# Patient Record
Sex: Female | Born: 1945 | Race: White | Hispanic: No | Smoking: Former smoker
Health system: Southern US, Community
[De-identification: ages and names within clinical notes are randomized; demographics above are authoritative.]

## PROBLEM LIST (undated history)

## (undated) DIAGNOSIS — R011 Cardiac murmur, unspecified: Secondary | ICD-10-CM

## (undated) DIAGNOSIS — J45909 Unspecified asthma, uncomplicated: Secondary | ICD-10-CM

## (undated) DIAGNOSIS — J349 Unspecified disorder of nose and nasal sinuses: Secondary | ICD-10-CM

## (undated) DIAGNOSIS — C801 Malignant (primary) neoplasm, unspecified: Secondary | ICD-10-CM

## (undated) DIAGNOSIS — R51 Headache: Secondary | ICD-10-CM

## (undated) DIAGNOSIS — E119 Type 2 diabetes mellitus without complications: Secondary | ICD-10-CM

## (undated) DIAGNOSIS — R519 Headache, unspecified: Secondary | ICD-10-CM

## (undated) DIAGNOSIS — H409 Unspecified glaucoma: Secondary | ICD-10-CM

## (undated) DIAGNOSIS — F319 Bipolar disorder, unspecified: Secondary | ICD-10-CM

## (undated) DIAGNOSIS — M1990 Unspecified osteoarthritis, unspecified site: Secondary | ICD-10-CM

## (undated) DIAGNOSIS — K449 Diaphragmatic hernia without obstruction or gangrene: Secondary | ICD-10-CM

## (undated) DIAGNOSIS — I10 Essential (primary) hypertension: Secondary | ICD-10-CM

## (undated) DIAGNOSIS — K219 Gastro-esophageal reflux disease without esophagitis: Secondary | ICD-10-CM

## (undated) DIAGNOSIS — E7800 Pure hypercholesterolemia, unspecified: Secondary | ICD-10-CM

## (undated) HISTORY — PX: ABDOMINAL HYSTERECTOMY: SHX81

## (undated) HISTORY — PX: CHOLECYSTECTOMY: SHX55

## (undated) HISTORY — PX: NASAL SINUS SURGERY: SHX719

## (undated) HISTORY — DX: Bipolar disorder, unspecified: F31.9

## (undated) HISTORY — DX: Unspecified disorder of nose and nasal sinuses: J34.9

## (undated) HISTORY — PX: TONSILLECTOMY: SUR1361

## (undated) HISTORY — DX: Diaphragmatic hernia without obstruction or gangrene: K44.9

## (undated) HISTORY — DX: Unspecified asthma, uncomplicated: J45.909

## (undated) HISTORY — PX: DILATION AND CURETTAGE OF UTERUS: SHX78

## (undated) HISTORY — DX: Type 2 diabetes mellitus without complications: E11.9

## (undated) HISTORY — PX: REPLACEMENT TOTAL KNEE: SUR1224

## (undated) HISTORY — PX: TUBAL LIGATION: SHX77

## (undated) HISTORY — DX: Gastro-esophageal reflux disease without esophagitis: K21.9

## (undated) HISTORY — DX: Pure hypercholesterolemia, unspecified: E78.00

## (undated) HISTORY — DX: Essential (primary) hypertension: I10

---

## 2009-02-08 HISTORY — PX: OTHER SURGICAL HISTORY: SHX169

## 2016-03-08 ENCOUNTER — Encounter: Payer: Self-pay | Admitting: Internal Medicine

## 2016-03-31 ENCOUNTER — Ambulatory Visit (INDEPENDENT_AMBULATORY_CARE_PROVIDER_SITE_OTHER): Payer: Medicare Other | Admitting: Gastroenterology

## 2016-03-31 ENCOUNTER — Encounter: Payer: Self-pay | Admitting: Gastroenterology

## 2016-03-31 ENCOUNTER — Other Ambulatory Visit: Payer: Self-pay

## 2016-03-31 VITALS — BP 111/67 | HR 86 | Temp 98.3°F | Ht 63.0 in | Wt 253.4 lb

## 2016-03-31 DIAGNOSIS — R198 Other specified symptoms and signs involving the digestive system and abdomen: Secondary | ICD-10-CM | POA: Diagnosis not present

## 2016-03-31 DIAGNOSIS — Z800 Family history of malignant neoplasm of digestive organs: Secondary | ICD-10-CM | POA: Insufficient documentation

## 2016-03-31 DIAGNOSIS — K5900 Constipation, unspecified: Secondary | ICD-10-CM

## 2016-03-31 MED ORDER — PEG 3350-KCL-NA BICARB-NACL 420 G PO SOLR: 4000.0000 mL | ORAL | 0 refills | Status: DC

## 2016-03-31 NOTE — Progress Notes (Addendum)
Primary Care Physician:  Keane Police, MD Primary Gastroenterologist:  Dr. Oneida Alar   Chief Complaint  Patient presents with  . Constipation  . Emesis    HPI:   Tammy Love is a 71 y.o. female presenting today at the request of her PCP secondary to constipation and vomiting.    Recently had some burning in rectum and some constipation. Would have some loose bowel movements for about 3 days, then start getting watery and took imodium. Today didn't want to come out but finally did. Feels like it just lays in the bottom of her rectum and has to take a tissue and push it out. Wonders if it is the imodium. Most days having to push it out. Present for a few months. Scant hematochezia after straining. Abdominal discomfort with constipation. Was taking trulicity, which she feels was causing N/V. After stopping Trulicity, this resolved. No dysphagia.   Colonoscopy by Dr. Earley Brooke 2011: mucosa up to terminal ileum appeared normal, biopsies from TI and rectum. Microscopic colitis.   Sister, Deirdre Pippins, recently diagnosed with colon cancer.  Past Medical History:  Diagnosis Date  . Asthma   . Bipolar disorder ([place])   . Diabetes ([place])   . GERD (gastroesophageal reflux disease)   . Hiatal hernia   . Hypercholesterolemia   . Hypertension   . Sinus disease     Past Surgical History:  Procedure Laterality Date  . ABDOMINAL HYSTERECTOMY    . colonoscopy  2011   Dr. Earley Brooke: mucosa up to terminal ileum appeared normal, biopsies from TI and rectum. MICROSCOPIC COLITIS   . NASAL SINUS SURGERY    . REPLACEMENT TOTAL KNEE     left  . TONSILLECTOMY    . TUBAL LIGATION      Current Outpatient Prescriptions  Medication Sig Dispense Refill  . aspirin 325 MG tablet Take 325 mg by mouth daily.    Marland Kitchen atorvastatin (LIPITOR) 80 MG tablet Take 80 mg by mouth daily.    . budesonide-formoterol (SYMBICORT) 160-4.5 MCG/ACT inhaler Inhale 2 puffs into the lungs 2 (two) times daily.    .  celecoxib (CELEBREX) 200 MG capsule Take 200 mg by mouth 2 (two) times daily.    . flunisolide (NASALIDE) 25 MCG/ACT (0.025%) SOLN Place 2 sprays into the nose 2 (two) times daily.    Marland Kitchen lisinopril (PRINIVIL,ZESTRIL) 10 MG tablet Take 10 mg by mouth daily.    . Multiple Vitamin (MULTIVITAMIN) capsule Take 1 capsule by mouth daily.    Marland Kitchen omeprazole (PRILOSEC) 20 MG capsule Take 20 mg by mouth daily.    . potassium chloride (MICRO-K) 10 MEQ CR capsule Take 10 mEq by mouth 2 (two) times daily.    . QUEtiapine (SEROQUEL) 25 MG tablet Take 25 mg by mouth at bedtime.    . raloxifene (EVISTA) 60 MG tablet Take 60 mg by mouth daily.    . sitaGLIPtin (JANUVIA) 100 MG tablet Take 100 mg by mouth daily.    Marland Kitchen topiramate (TOPAMAX) 100 MG tablet Take 150 mg by mouth. 3 at bedtime    . traMADol (ULTRAM) 50 MG tablet Take 50 mg by mouth every 6 (six) hours as needed.     No current facility-administered medications for this visit.     Allergies as of 03/31/2016 - Review Complete 03/31/2016  Allergen Reaction Noted  . Ivp dye [iodinated diagnostic agents]  03/31/2016  . Penicillins Itching and Rash 12/15/2009  . Sulfa antibiotics Itching and Rash 12/15/2009  . Sulfamethoxazole-trimethoprim Itching  and Rash 12/15/2009  . Sulfasalazine Itching and Rash 12/15/2009    Family History  Problem Relation Age of Onset  . Colon cancer Sister [place]     Social History   Social History  . Marital status: Widowed    Spouse name: N/A  . Number of children: N/A  . Years of education: N/A   Occupational History  . Not on file.   Social History Main Topics  . Smoking status: Never Smoker  . Smokeless tobacco: Never Used  . Alcohol use No  . Drug use: No  . Sexual activity: Not on file   Other Topics Concern  . Not on file   Social History Narrative  . No narrative on file    Review of Systems: Gen: Denies any fever, chills, fatigue, weight loss, lack of appetite.  CV: Denies chest  pain, heart palpitations, peripheral edema, syncope.  Resp: +DOE GI: see HPI  GU : Denies urinary burning, urinary frequency, urinary hesitancy MS: +muscle aches/pains  Derm: Denies rash, itching, dry skin Psych: Denies depression, anxiety, memory loss, and confusion Heme: see HPI   Physical Exam: BP 111/67   Pulse 86   Temp 98.3 F (36.8 C) (Oral)   Ht 5\' 3"  (1.6 m)   Wt 253 lb 6.4 oz (114.9 kg)   BMI 44.89 kg/m  General:   Alert and oriented. Pleasant and cooperative. Well-nourished and well-developed.  Head:  Normocephalic and atraumatic. Eyes:  Without icterus, sclera clear and conjunctiva pink.  Ears:  Normal auditory acuity. Nose:  No deformity, discharge,  or lesions. Mouth:  No deformity or lesions, oral mucosa pink.  Lungs:  Clear to auscultation bilaterally. No wheezes, rales, or rhonchi. No distress.  Heart:  S1, S2 present with systolic murmur  Abdomen:  +BS, soft, obese. Left-sided abdomen notably larger than right. Abdominal fullness and laxity, question hernia Rectal:  Small non-thrombosed anterior hemorrhoid, no internal mass appreciated  Msk:  Symmetrical without gross deformities. Normal posture. Extremities:  Without edema. Neurologic:  Alert and  oriented x4 Psych:  Alert and cooperative. Normal mood and affect.

## 2016-03-31 NOTE — Patient Instructions (Signed)
We have scheduled you for a CT scan to further evaluate your abdomen.   Start taking Amitiza 8 mcg with breakfast and dinner for constipation. Make sure you take with food to avoid nausea. I have given you samples. Let me know how this goes!  We have scheduled you for a colonoscopy with Dr. Oneida Alar in the near future!

## 2016-04-01 ENCOUNTER — Telehealth: Payer: Self-pay

## 2016-04-01 NOTE — Telephone Encounter (Signed)
Called pt and informed pt of pre-op appt 04/08/16 at 12:45pm.

## 2016-04-05 ENCOUNTER — Encounter: Payer: Self-pay | Admitting: Gastroenterology

## 2016-04-05 NOTE — Assessment & Plan Note (Signed)
Start Amitiza 8 mcg po BID. Samples provided. As of note, patient reports having to digitally disimpact herself. Rectal exam performed without obvious mass. Hemorrhoids likely culprit of low-volume hematochezia but with family history of colon cancer, needs colonoscopy.

## 2016-04-05 NOTE — Assessment & Plan Note (Signed)
71 year old female with family history of colon cancer (sister just diagnosed), presenting with constipation, heme positive stool. As of note, vomiting resolved after discontinuing Trulicity, and she has no other upper GI symptoms of concern. On exam, she has left-sided abdomen "fullness" and laxity, query hernia. Will order CT prior to colonoscopy to evaluate anatomy.  Proceed with colonoscopy with Dr. Oneida Alar in the near future. The risks, benefits, and alternatives have been discussed in detail with the patient. They state understanding and desire to proceed.  PROPOFOL due to polypharmacy Amitiza 8 mcg for constipation  CT ordered for further evaluation of abdomen

## 2016-04-05 NOTE — Assessment & Plan Note (Signed)
CT ordered. 

## 2016-04-06 NOTE — Patient Instructions (Signed)
Tammy Love  04/06/2016     @PREFPERIOPPHARMACY @   Your procedure is scheduled on  04/13/2016   Report to [facility] at  64  A.M.  Call this number if you have problems the morning of surgery:  [phone]   Remember:  Do not eat food or drink liquids after midnight.  Take these medicines the morning of surgery with A SIP OF WATER  Celebrex, lisinopril, prilosec, topamax, ultram. Take your inhalers before you come and bring any rescue inhaler with you.   Do not wear jewelry, make-up or nail polish.  Do not wear lotions, powders, or perfumes, or deoderant.  Do not shave 48 hours prior to surgery.  Men may shave face and neck.  Do not bring valuables to the hospital.  [facility] is not responsible for any belongings or valuables.  Contacts, dentures or bridgework may not be worn into surgery.  Leave your suitcase in the car.  After surgery it may be brought to your room.  For patients admitted to the hospital, discharge time will be determined by your treatment team.  Patients discharged the day of surgery will not be allowed to drive home.   Name and phone number of your driver:   family Special instructions:  Follow the diet and prep instructions given to you by Dr Nona Dell office.  Please read over the following fact sheets that you were given. Anesthesia Post-op Instructions and Care and Recovery After Surgery       Colonoscopy, Adult A colonoscopy is an exam to look at the entire large intestine. During the exam, a lubricated, bendable tube is inserted into the anus and then passed into the rectum, colon, and other parts of the large intestine. A colonoscopy is often done as a part of normal colorectal screening or in response to certain symptoms, such as anemia, persistent diarrhea, abdominal pain, and blood in the stool. The exam can help screen for and diagnose medical problems, including:  Tumors.  Polyps.  Inflammation.  Areas of bleeding. Tell  a health care provider about:  Any allergies you have.  All medicines you are taking, including vitamins, herbs, eye drops, creams, and over-the-counter medicines.  Any problems you or family members have had with anesthetic medicines.  Any blood disorders you have.  Any surgeries you have had.  Any medical conditions you have.  Any problems you have had passing stool. What are the risks? Generally, this is a safe procedure. However, problems may occur, including:  Bleeding.  A tear in the intestine.  A reaction to medicines given during the exam.  Infection (rare). What happens before the procedure? Eating and drinking restrictions  Follow instructions from your health care provider about eating and drinking, which may include:  A few days before the procedure - follow a low-fiber diet. Avoid nuts, seeds, dried fruit, raw fruits, and vegetables.  1-3 days before the procedure - follow a clear liquid diet. Drink only clear liquids, such as clear broth or bouillon, black coffee or tea, clear juice, clear soft drinks or sports drinks, gelatin dessert, and popsicles. Avoid any liquids that contain red or purple dye.  On the day of the procedure - do not eat or drink anything during the 2 hours before the procedure, or within the time period that your health care provider recommends. Bowel prep  If you were prescribed an oral bowel prep to clean out your colon:  Take it  as told by your health care provider. Starting the day before your procedure, you will need to drink a large amount of medicated liquid. The liquid will cause you to have multiple loose stools until your stool is almost clear or light green.  If your skin or anus gets irritated from diarrhea, you may use these to relieve the irritation:  Medicated wipes, such as adult wet wipes with aloe and vitamin E.  A skin soothing-product like petroleum jelly.  If you vomit while drinking the bowel prep, take a break for  up to 60 minutes and then begin the bowel prep again. If vomiting continues and you cannot take the bowel prep without vomiting, call your health care provider. General instructions   Ask your health care provider about changing or stopping your regular medicines. This is especially important if you are taking diabetes medicines or blood thinners.  Plan to have someone take you home from the hospital or clinic. What happens during the procedure?  An IV tube may be inserted into one of your veins.  You will be given medicine to help you relax (sedative).  To reduce your risk of infection:  Your health care team will wash or sanitize their hands.  Your anal area will be washed with soap.  You will be asked to lie on your side with your knees bent.  Your health care provider will lubricate a long, thin, flexible tube. The tube will have a camera and a light on the end.  The tube will be inserted into your anus.  The tube will be gently eased through your rectum and colon.  Air will be delivered into your colon to keep it open. You may feel some pressure or cramping.  The camera will be used to take images during the procedure.  A small tissue sample may be removed from your body to be examined under a microscope (biopsy). If any potential problems are found, the tissue will be sent to a lab for testing.  If small polyps are found, your health care provider may remove them and have them checked for cancer cells.  The tube that was inserted into your anus will be slowly removed. The procedure may vary among health care providers and hospitals. What happens after the procedure?  Your blood pressure, heart rate, breathing rate, and blood oxygen level will be monitored until the medicines you were given have worn off.  Do not drive for 24 hours after the exam.  You may have a small amount of blood in your stool.  You may pass gas and have mild abdominal cramping or bloating due to  the air that was used to inflate your colon during the exam.  It is up to you to get the results of your procedure. Ask your health care provider, or the department performing the procedure, when your results will be ready. This information is not intended to replace advice given to you by your health care provider. Make sure you discuss any questions you have with your health care provider. Document Released: 01/23/2000 Document Revised: 11/26/2015 Document Reviewed: 04/08/2015 Elsevier Interactive Patient Education  2017 [employer]  Colonoscopy, Adult, Care After This sheet gives you information about how to care for yourself after your procedure. Your health care provider may also give you more specific instructions. If you have problems or questions, contact your health care provider. What can I expect after the procedure? After the procedure, it is common to have:  A small  amount of blood in your stool for 24 hours after the procedure.  Some gas.  Mild abdominal cramping or bloating. Follow these instructions at home: General instructions    For the first 24 hours after the procedure:  Do not drive or use machinery.  Do not sign important documents.  Do not drink alcohol.  Do your regular daily activities at a slower pace than normal.  Eat soft, easy-to-digest foods.  Rest often.  Take over-the-counter or prescription medicines only as told by your health care provider.  It is up to you to get the results of your procedure. Ask your health care provider, or the department performing the procedure, when your results will be ready. Relieving cramping and bloating   Try walking around when you have cramps or feel bloated.  Apply heat to your abdomen as told by your health care provider. Use a heat source that your health care provider recommends, such as a moist heat pack or a heating pad.  Place a towel between your skin and the heat source.  Leave the heat on for  20-30 minutes.  Remove the heat if your skin turns bright red. This is especially important if you are unable to feel pain, heat, or cold. You may have a greater risk of getting burned. Eating and drinking   Drink enough fluid to keep your urine clear or pale yellow.  Resume your normal diet as instructed by your health care provider. Avoid heavy or fried foods that are hard to digest.  Avoid drinking alcohol for as long as instructed by your health care provider. Contact a health care provider if:  You have blood in your stool 2-3 days after the procedure. Get help right away if:  You have more than a small spotting of blood in your stool.  You pass large blood clots in your stool.  Your abdomen is swollen.  You have nausea or vomiting.  You have a fever.  You have increasing abdominal pain that is not relieved with medicine. This information is not intended to replace advice given to you by your health care provider. Make sure you discuss any questions you have with your health care provider. Document Released: 09/09/2003 Document Revised: 10/20/2015 Document Reviewed: 04/08/2015 Elsevier Interactive Patient Education  2017 [place] Anesthesia is a term that refers to techniques, procedures, and medicines that help a person stay safe and comfortable during a medical procedure. Monitored anesthesia care, or sedation, is one type of anesthesia. Your anesthesia specialist may recommend sedation if you will be having a procedure that does not require you to be unconscious, such as:  Cataract surgery.  A dental procedure.  A biopsy.  A colonoscopy. During the procedure, you may receive a medicine to help you relax (sedative). There are three levels of sedation:  Mild sedation. At this level, you may feel awake and relaxed. You will be able to follow directions.  Moderate sedation. At this level, you will be sleepy. You may not remember the  procedure.  Deep sedation. At this level, you will be asleep. You will not remember the procedure. The more medicine you are given, the deeper your level of sedation will be. Depending on how you respond to the procedure, the anesthesia specialist may change your level of sedation or the type of anesthesia to fit your needs. An anesthesia specialist will monitor you closely during the procedure. Let your health care provider know about:  Any allergies you have.  All medicines you are taking, including vitamins, herbs, eye drops, creams, and over-the-counter medicines.  Any use of steroids (by mouth or as a cream).  Any problems you or family members have had with sedatives and anesthetic medicines.  Any blood disorders you have.  Any surgeries you have had.  Any medical conditions you have, such as sleep apnea.  Whether you are pregnant or may be pregnant.  Any use of cigarettes, alcohol, or street drugs. What are the risks? Generally, this is a safe procedure. However, problems may occur, including:  Getting too much medicine (oversedation).  Nausea.  Allergic reaction to medicines.  Trouble breathing. If this happens, a breathing tube may be used to help with breathing. It will be removed when you are awake and breathing on your own.  Heart trouble.  Lung trouble. Before the procedure Staying hydrated  Follow instructions from your health care provider about hydration, which may include:  Up to 2 hours before the procedure - you may continue to drink clear liquids, such as water, clear fruit juice, black coffee, and plain tea. Eating and drinking restrictions  Follow instructions from your health care provider about eating and drinking, which may include:  8 hours before the procedure - stop eating heavy meals or foods such as meat, fried foods, or fatty foods.  6 hours before the procedure - stop eating light meals or foods, such as toast or cereal.  6 hours before  the procedure - stop drinking milk or drinks that contain milk.  2 hours before the procedure - stop drinking clear liquids. Medicines  Ask your health care provider about:  Changing or stopping your regular medicines. This is especially important if you are taking diabetes medicines or blood thinners.  Taking medicines such as aspirin and ibuprofen. These medicines can thin your blood. Do not take these medicines before your procedure if your health care provider instructs you not to. Tests and exams  You will have a physical exam.  You may have blood tests done to show:  How well your kidneys and liver are working.  How well your blood can clot.  General instructions  Plan to have someone take you home from the hospital or clinic.  If you will be going home right after the procedure, plan to have someone with you for 24 hours. What happens during the procedure?  Your blood pressure, heart rate, breathing, level of pain and overall condition will be monitored.  An IV tube will be inserted into one of your veins.  Your anesthesia specialist will give you medicines as needed to keep you comfortable during the procedure. This may mean changing the level of sedation.  The procedure will be performed. After the procedure  Your blood pressure, heart rate, breathing rate, and blood oxygen level will be monitored until the medicines you were given have worn off.  Do not drive for 24 hours if you received a sedative.  You may:  Feel sleepy, clumsy, or nauseous.  Feel forgetful about what happened after the procedure.  Have a sore throat if you had a breathing tube during the procedure.  Vomit. This information is not intended to replace advice given to you by your health care provider. Make sure you discuss any questions you have with your health care provider. Document Released: 10/21/2004 Document Revised: 07/04/2015 Document Reviewed: 05/18/2015 Elsevier Interactive  Patient Education  2017 [place], Care After These instructions provide you with information about caring for  yourself after your procedure. Your health care provider may also give you more specific instructions. Your treatment has been planned according to current medical practices, but problems sometimes occur. Call your health care provider if you have any problems or questions after your procedure. What can I expect after the procedure? After your procedure, it is common to:  Feel sleepy for several hours.  Feel clumsy and have poor balance for several hours.  Feel forgetful about what happened after the procedure.  Have poor judgment for several hours.  Feel nauseous or vomit.  Have a sore throat if you had a breathing tube during the procedure. Follow these instructions at home: For at least 24 hours after the procedure:    Do not:  Participate in activities in which you could fall or become injured.  Drive.  Use heavy machinery.  Drink alcohol.  Take sleeping pills or medicines that cause drowsiness.  Make important decisions or sign legal documents.  Take care of children on your own.  Rest. Eating and drinking   Follow the diet that is recommended by your health care provider.  If you vomit, drink water, juice, or soup when you can drink without vomiting.  Make sure you have little or no nausea before eating solid foods. General instructions   Have a responsible adult stay with you until you are awake and alert.  Take over-the-counter and prescription medicines only as told by your health care provider.  If you smoke, do not smoke without supervision.  Keep all follow-up visits as told by your health care provider. This is important. Contact a health care provider if:  You keep feeling nauseous or you keep vomiting.  You feel light-headed.  You develop a rash.  You have a fever. Get help right away if:  You have  trouble breathing. This information is not intended to replace advice given to you by your health care provider. Make sure you discuss any questions you have with your health care provider. Document Released: 05/18/2015 Document Revised: 09/17/2015 Document Reviewed: 05/18/2015 Elsevier Interactive Patient Education  2017 [employer].

## 2016-04-07 NOTE — Progress Notes (Signed)
CC'ED TO PCP 

## 2016-04-08 ENCOUNTER — Encounter (HOSPITAL_COMMUNITY)
Admission: RE | Admit: 2016-04-08 | Discharge: 2016-04-08 | Disposition: A | Discharge status: Home / Self Care | Payer: Medicare Other | Source: Ambulatory Visit | Attending: Gastroenterology | Admitting: Gastroenterology

## 2016-04-08 ENCOUNTER — Ambulatory Visit (HOSPITAL_COMMUNITY)
Admission: RE | Admit: 2016-04-08 | Discharge: 2016-04-08 | Disposition: A | Discharge status: Home / Self Care | Payer: Medicare Other | Source: Ambulatory Visit | Attending: Gastroenterology | Admitting: Gastroenterology

## 2016-04-08 ENCOUNTER — Encounter (HOSPITAL_COMMUNITY): Payer: Self-pay

## 2016-04-08 DIAGNOSIS — K4020 Bilateral inguinal hernia, without obstruction or gangrene, not specified as recurrent: Secondary | ICD-10-CM | POA: Insufficient documentation

## 2016-04-08 DIAGNOSIS — R198 Other specified symptoms and signs involving the digestive system and abdomen: Secondary | ICD-10-CM | POA: Diagnosis not present

## 2016-04-08 DIAGNOSIS — K429 Umbilical hernia without obstruction or gangrene: Secondary | ICD-10-CM | POA: Insufficient documentation

## 2016-04-08 HISTORY — DX: Cardiac murmur, unspecified: R01.1

## 2016-04-08 HISTORY — DX: Headache: R51

## 2016-04-08 HISTORY — DX: Headache, unspecified: R51.9

## 2016-04-08 HISTORY — DX: Unspecified osteoarthritis, unspecified site: M19.90

## 2016-04-08 HISTORY — DX: Malignant (primary) neoplasm, unspecified: C80.1

## 2016-04-08 LAB — BASIC METABOLIC PANEL
Anion gap: 6 (ref 5–15)
BUN: 12 mg/dL (ref 6–20)
CALCIUM: 8.9 mg/dL (ref 8.9–10.3)
CO2: 25 mmol/L (ref 22–32)
Chloride: 110 mmol/L (ref 101–111)
Creatinine, Ser: 0.9 mg/dL (ref 0.44–1.00)
GFR calc Af Amer: >60 mL/min (ref >60)
Glucose, Bld: 103 mg/dL — ABNORMAL HIGH (ref 65–99)
Potassium: 3.4 mmol/L — ABNORMAL LOW (ref 3.5–5.1)
SODIUM: 141 mmol/L (ref 135–145)

## 2016-04-08 LAB — CBC WITH DIFFERENTIAL/PLATELET
BASOS PCT: 0 %
Basophils Absolute: 0 10*3/uL (ref 0.0–0.1)
EOS ABS: 0.2 10*3/uL (ref 0.0–0.7)
Eosinophils Relative: 3 %
HCT: 36.2 % (ref 36.0–46.0)
HEMOGLOBIN: 11.9 g/dL — AB (ref 12.0–15.0)
Lymphocytes Relative: 19 %
Lymphs Abs: 1.5 10*3/uL (ref 0.7–4.0)
MCH: 28.9 pg (ref 26.0–34.0)
MCHC: 32.9 g/dL (ref 30.0–36.0)
MCV: 87.9 fL (ref 78.0–100.0)
Monocytes Absolute: 0.5 10*3/uL (ref 0.1–1.0)
Monocytes Relative: 6 %
NEUTROS ABS: 5.8 10*3/uL (ref 1.7–7.7)
NEUTROS PCT: 72 %
Platelets: 250 10*3/uL (ref 150–400)
RBC: 4.12 MIL/uL (ref 3.87–5.11)
RDW: 13.8 % (ref 11.5–15.5)
WBC: 8 10*3/uL (ref 4.0–10.5)

## 2016-04-12 ENCOUNTER — Other Ambulatory Visit: Payer: Self-pay

## 2016-04-19 NOTE — Progress Notes (Signed)
CT abdomen reviewed. Mild stranding of pericolonic fat in left pelvis but clinically not presenting with signs of panniculitis or appendagitis. She has asymmetric mild thickening of subcutaneous fat in LUQ abdominal wall as noted clinically and likely an anatomic variant. No ventral hernia noted. Proceed with procedures as planned.

## 2016-04-19 NOTE — Patient Instructions (Signed)
Tammy Love  04/19/2016     @PREFPERIOPPHARMACY @   Your procedure is scheduled on  04/27/2016   Report to [facility] at  615  A.M.  Call this number if you have problems the morning of surgery:  [phone]   Remember:  Do not eat food or drink liquids after midnight.  Take these medicines the morning of surgery with A SIP OF WATER  Celebrex, lisinopril, prilosec, evista, topamax, tramdol. Take your inhalers before you come. DO NOT take any medications for diabetes the morning of your procedure.   Do not wear jewelry, make-up or nail polish.  Do not wear lotions, powders, or perfumes, or deoderant.  Do not shave 48 hours prior to surgery.  Men may shave face and neck.  Do not bring valuables to the hospital.  [facility] is not responsible for any belongings or valuables.  Contacts, dentures or bridgework may not be worn into surgery.  Leave your suitcase in the car.  After surgery it may be brought to your room.  For patients admitted to the hospital, discharge time will be determined by your treatment team.  Patients discharged the day of surgery will not be allowed to drive home.   Name and phone number of your driver:   family Special instructions:  Follow the diet and prep instructions given to you by Dr Nona Dell office.  Please read over the following fact sheets that you were given. Anesthesia Post-op Instructions and Care and Recovery After Surgery       Colonoscopy, Adult A colonoscopy is an exam to look at the entire large intestine. During the exam, a lubricated, bendable tube is inserted into the anus and then passed into the rectum, colon, and other parts of the large intestine. A colonoscopy is often done as a part of normal colorectal screening or in response to certain symptoms, such as anemia, persistent diarrhea, abdominal pain, and blood in the stool. The exam can help screen for and diagnose medical problems,  including:  Tumors.  Polyps.  Inflammation.  Areas of bleeding. Tell a health care provider about:  Any allergies you have.  All medicines you are taking, including vitamins, herbs, eye drops, creams, and over-the-counter medicines.  Any problems you or family members have had with anesthetic medicines.  Any blood disorders you have.  Any surgeries you have had.  Any medical conditions you have.  Any problems you have had passing stool. What are the risks? Generally, this is a safe procedure. However, problems may occur, including:  Bleeding.  A tear in the intestine.  A reaction to medicines given during the exam.  Infection (rare). What happens before the procedure? Eating and drinking restrictions  Follow instructions from your health care provider about eating and drinking, which may include:  A few days before the procedure - follow a low-fiber diet. Avoid nuts, seeds, dried fruit, raw fruits, and vegetables.  1-3 days before the procedure - follow a clear liquid diet. Drink only clear liquids, such as clear broth or bouillon, black coffee or tea, clear juice, clear soft drinks or sports drinks, gelatin dessert, and popsicles. Avoid any liquids that contain red or purple dye.  On the day of the procedure - do not eat or drink anything during the 2 hours before the procedure, or within the time period that your health care provider recommends. Bowel prep  If you were prescribed an oral bowel prep to clean  out your colon:  Take it as told by your health care provider. Starting the day before your procedure, you will need to drink a large amount of medicated liquid. The liquid will cause you to have multiple loose stools until your stool is almost clear or light green.  If your skin or anus gets irritated from diarrhea, you may use these to relieve the irritation:  Medicated wipes, such as adult wet wipes with aloe and vitamin E.  A skin soothing-product like  petroleum jelly.  If you vomit while drinking the bowel prep, take a break for up to 60 minutes and then begin the bowel prep again. If vomiting continues and you cannot take the bowel prep without vomiting, call your health care provider. General instructions   Ask your health care provider about changing or stopping your regular medicines. This is especially important if you are taking diabetes medicines or blood thinners.  Plan to have someone take you home from the hospital or clinic. What happens during the procedure?  An IV tube may be inserted into one of your veins.  You will be given medicine to help you relax (sedative).  To reduce your risk of infection:  Your health care team will wash or sanitize their hands.  Your anal area will be washed with soap.  You will be asked to lie on your side with your knees bent.  Your health care provider will lubricate a long, thin, flexible tube. The tube will have a camera and a light on the end.  The tube will be inserted into your anus.  The tube will be gently eased through your rectum and colon.  Air will be delivered into your colon to keep it open. You may feel some pressure or cramping.  The camera will be used to take images during the procedure.  A small tissue sample may be removed from your body to be examined under a microscope (biopsy). If any potential problems are found, the tissue will be sent to a lab for testing.  If small polyps are found, your health care provider may remove them and have them checked for cancer cells.  The tube that was inserted into your anus will be slowly removed. The procedure may vary among health care providers and hospitals. What happens after the procedure?  Your blood pressure, heart rate, breathing rate, and blood oxygen level will be monitored until the medicines you were given have worn off.  Do not drive for 24 hours after the exam.  You may have a small amount of blood in  your stool.  You may pass gas and have mild abdominal cramping or bloating due to the air that was used to inflate your colon during the exam.  It is up to you to get the results of your procedure. Ask your health care provider, or the department performing the procedure, when your results will be ready. This information is not intended to replace advice given to you by your health care provider. Make sure you discuss any questions you have with your health care provider. Document Released: 01/23/2000 Document Revised: 11/26/2015 Document Reviewed: 04/08/2015 Elsevier Interactive Patient Education  2017 [employer]  Colonoscopy, Adult, Care After This sheet gives you information about how to care for yourself after your procedure. Your health care provider may also give you more specific instructions. If you have problems or questions, contact your health care provider. What can I expect after the procedure? After the procedure, it is  common to have:  A small amount of blood in your stool for 24 hours after the procedure.  Some gas.  Mild abdominal cramping or bloating. Follow these instructions at home: General instructions    For the first 24 hours after the procedure:  Do not drive or use machinery.  Do not sign important documents.  Do not drink alcohol.  Do your regular daily activities at a slower pace than normal.  Eat soft, easy-to-digest foods.  Rest often.  Take over-the-counter or prescription medicines only as told by your health care provider.  It is up to you to get the results of your procedure. Ask your health care provider, or the department performing the procedure, when your results will be ready. Relieving cramping and bloating   Try walking around when you have cramps or feel bloated.  Apply heat to your abdomen as told by your health care provider. Use a heat source that your health care provider recommends, such as a moist heat pack or a heating  pad.  Place a towel between your skin and the heat source.  Leave the heat on for 20-30 minutes.  Remove the heat if your skin turns bright red. This is especially important if you are unable to feel pain, heat, or cold. You may have a greater risk of getting burned. Eating and drinking   Drink enough fluid to keep your urine clear or pale yellow.  Resume your normal diet as instructed by your health care provider. Avoid heavy or fried foods that are hard to digest.  Avoid drinking alcohol for as long as instructed by your health care provider. Contact a health care provider if:  You have blood in your stool 2-3 days after the procedure. Get help right away if:  You have more than a small spotting of blood in your stool.  You pass large blood clots in your stool.  Your abdomen is swollen.  You have nausea or vomiting.  You have a fever.  You have increasing abdominal pain that is not relieved with medicine. This information is not intended to replace advice given to you by your health care provider. Make sure you discuss any questions you have with your health care provider. Document Released: 09/09/2003 Document Revised: 10/20/2015 Document Reviewed: 04/08/2015 Elsevier Interactive Patient Education  2017 [place] Anesthesia is a term that refers to techniques, procedures, and medicines that help a person stay safe and comfortable during a medical procedure. Monitored anesthesia care, or sedation, is one type of anesthesia. Your anesthesia specialist may recommend sedation if you will be having a procedure that does not require you to be unconscious, such as:  Cataract surgery.  A dental procedure.  A biopsy.  A colonoscopy. During the procedure, you may receive a medicine to help you relax (sedative). There are three levels of sedation:  Mild sedation. At this level, you may feel awake and relaxed. You will be able to follow  directions.  Moderate sedation. At this level, you will be sleepy. You may not remember the procedure.  Deep sedation. At this level, you will be asleep. You will not remember the procedure. The more medicine you are given, the deeper your level of sedation will be. Depending on how you respond to the procedure, the anesthesia specialist may change your level of sedation or the type of anesthesia to fit your needs. An anesthesia specialist will monitor you closely during the procedure. Let your health care provider know  about:  Any allergies you have.  All medicines you are taking, including vitamins, herbs, eye drops, creams, and over-the-counter medicines.  Any use of steroids (by mouth or as a cream).  Any problems you or family members have had with sedatives and anesthetic medicines.  Any blood disorders you have.  Any surgeries you have had.  Any medical conditions you have, such as sleep apnea.  Whether you are pregnant or may be pregnant.  Any use of cigarettes, alcohol, or street drugs. What are the risks? Generally, this is a safe procedure. However, problems may occur, including:  Getting too much medicine (oversedation).  Nausea.  Allergic reaction to medicines.  Trouble breathing. If this happens, a breathing tube may be used to help with breathing. It will be removed when you are awake and breathing on your own.  Heart trouble.  Lung trouble. Before the procedure Staying hydrated  Follow instructions from your health care provider about hydration, which may include:  Up to 2 hours before the procedure - you may continue to drink clear liquids, such as water, clear fruit juice, black coffee, and plain tea. Eating and drinking restrictions  Follow instructions from your health care provider about eating and drinking, which may include:  8 hours before the procedure - stop eating heavy meals or foods such as meat, fried foods, or fatty foods.  6 hours  before the procedure - stop eating light meals or foods, such as toast or cereal.  6 hours before the procedure - stop drinking milk or drinks that contain milk.  2 hours before the procedure - stop drinking clear liquids. Medicines  Ask your health care provider about:  Changing or stopping your regular medicines. This is especially important if you are taking diabetes medicines or blood thinners.  Taking medicines such as aspirin and ibuprofen. These medicines can thin your blood. Do not take these medicines before your procedure if your health care provider instructs you not to. Tests and exams  You will have a physical exam.  You may have blood tests done to show:  How well your kidneys and liver are working.  How well your blood can clot.  General instructions  Plan to have someone take you home from the hospital or clinic.  If you will be going home right after the procedure, plan to have someone with you for 24 hours. What happens during the procedure?  Your blood pressure, heart rate, breathing, level of pain and overall condition will be monitored.  An IV tube will be inserted into one of your veins.  Your anesthesia specialist will give you medicines as needed to keep you comfortable during the procedure. This may mean changing the level of sedation.  The procedure will be performed. After the procedure  Your blood pressure, heart rate, breathing rate, and blood oxygen level will be monitored until the medicines you were given have worn off.  Do not drive for 24 hours if you received a sedative.  You may:  Feel sleepy, clumsy, or nauseous.  Feel forgetful about what happened after the procedure.  Have a sore throat if you had a breathing tube during the procedure.  Vomit. This information is not intended to replace advice given to you by your health care provider. Make sure you discuss any questions you have with your health care provider. Document  Released: 10/21/2004 Document Revised: 07/04/2015 Document Reviewed: 05/18/2015 Elsevier Interactive Patient Education  2017 [place], Care After These instructions  provide you with information about caring for yourself after your procedure. Your health care provider may also give you more specific instructions. Your treatment has been planned according to current medical practices, but problems sometimes occur. Call your health care provider if you have any problems or questions after your procedure. What can I expect after the procedure? After your procedure, it is common to:  Feel sleepy for several hours.  Feel clumsy and have poor balance for several hours.  Feel forgetful about what happened after the procedure.  Have poor judgment for several hours.  Feel nauseous or vomit.  Have a sore throat if you had a breathing tube during the procedure. Follow these instructions at home: For at least 24 hours after the procedure:    Do not:  Participate in activities in which you could fall or become injured.  Drive.  Use heavy machinery.  Drink alcohol.  Take sleeping pills or medicines that cause drowsiness.  Make important decisions or sign legal documents.  Take care of children on your own.  Rest. Eating and drinking   Follow the diet that is recommended by your health care provider.  If you vomit, drink water, juice, or soup when you can drink without vomiting.  Make sure you have little or no nausea before eating solid foods. General instructions   Have a responsible adult stay with you until you are awake and alert.  Take over-the-counter and prescription medicines only as told by your health care provider.  If you smoke, do not smoke without supervision.  Keep all follow-up visits as told by your health care provider. This is important. Contact a health care provider if:  You keep feeling nauseous or you keep  vomiting.  You feel light-headed.  You develop a rash.  You have a fever. Get help right away if:  You have trouble breathing. This information is not intended to replace advice given to you by your health care provider. Make sure you discuss any questions you have with your health care provider. Document Released: 05/18/2015 Document Revised: 09/17/2015 Document Reviewed: 05/18/2015 Elsevier Interactive Patient Education  2017 [employer].

## 2016-04-21 NOTE — Progress Notes (Signed)
I called and informed pt. She said she does not feel that she will be able to proceed with procedures. She has had diarrhea and some vomiting since the CT. She said she has had it intermittent since August 2017. She is afraid she will aspirate if she had the procedures. She is having some sinus problems and supposed to have surgery but the surgery has informed her it will be postponed because he is going out of the country. She said that is what is causing her vomiting. Vicente Males, please advise!

## 2016-04-22 ENCOUNTER — Telehealth: Payer: Self-pay

## 2016-04-22 ENCOUNTER — Encounter (HOSPITAL_COMMUNITY)
Admission: RE | Admit: 2016-04-22 | Discharge: 2016-04-22 | Disposition: A | Discharge status: Home / Self Care | Payer: Medicare Other | Source: Ambulatory Visit | Attending: Gastroenterology | Admitting: Gastroenterology

## 2016-04-22 NOTE — Telephone Encounter (Signed)
Tammy Love is aware to take her off the schedule.

## 2016-04-22 NOTE — Telephone Encounter (Signed)
Pt called this morning because she went to the ER last night in [place]. She is not able to keep anything down. She is throwing up and diarrhea. She is worried because she is going to have a TCS done on 04/27/16 but she said that she was not going to be able to get medicine down. Please advise

## 2016-04-22 NOTE — Telephone Encounter (Signed)
Her N/V had resolved when I had seen her and was not having an issue. She was also constipated. She was given Amitiza. This has a side effect of nausea. Have her stop Amitiza if she is still taking it. We need to postpone the procedures because she is in no shape to do it right now. Also, she needs to go back to the ED if she is absolutely unable to keep anything down. Needs further work-up with that. I am happy to see her in the office once she has gotten these acute symptoms under control, but unfortunately I can't do anything from her in the office if she can't tolerate liquids. Routing to [place] as well.

## 2016-04-22 NOTE — Pre-Procedure Instructions (Signed)
Called patient to do preop phone call and she told me she had been in the ED last night and "I am so sick and I dont know about doing this". Instructed her to call Dr Nona Dell office and speak to her or one of the PA's and see what her next step is. Gave her the number to call. Called Ginger at [facility] and left her a message about above so she could have someone contact patient.

## 2016-04-22 NOTE — Progress Notes (Signed)
Noted and called pt.

## 2016-04-22 NOTE — Progress Notes (Signed)
Please see phone note dated 3/15.

## 2016-04-22 NOTE — Telephone Encounter (Signed)
I called pt. She said she has not been taking the Amitiza, because it caused her bowels to be very loose. She still cannot keep anything down, liquids, or nothing. I told her Vicente Males said to go back to the ED if she cannot tolerate liquids yet. She said she will go to [facility] ED this time. She is aware we are canceling the procedures for now.  Forwarding to Ginger to cancel the procedures.

## 2016-04-27 ENCOUNTER — Ambulatory Visit (HOSPITAL_COMMUNITY): Admission: RE | Admit: 2016-04-27 | Payer: Medicare Other | Source: Ambulatory Visit | Admitting: Gastroenterology

## 2016-04-27 ENCOUNTER — Encounter (HOSPITAL_COMMUNITY): Admission: RE | Payer: Self-pay | Source: Ambulatory Visit

## 2016-04-27 SURGERY — COLONOSCOPY WITH PROPOFOL
Anesthesia: Monitor Anesthesia Care

## 2016-07-01 NOTE — Progress Notes (Signed)
REVIEWED-NO ADDITIONAL RECOMMENDATIONS. 

## 2017-03-15 ENCOUNTER — Encounter: Payer: Self-pay | Admitting: Gastroenterology

## 2017-04-28 ENCOUNTER — Encounter: Payer: Self-pay | Admitting: *Deleted

## 2017-04-28 ENCOUNTER — Telehealth: Payer: Self-pay | Admitting: *Deleted

## 2017-04-28 ENCOUNTER — Encounter: Payer: Self-pay | Admitting: Gastroenterology

## 2017-04-28 ENCOUNTER — Other Ambulatory Visit: Payer: Self-pay | Admitting: *Deleted

## 2017-04-28 ENCOUNTER — Ambulatory Visit (INDEPENDENT_AMBULATORY_CARE_PROVIDER_SITE_OTHER): Payer: Medicare Other | Admitting: Gastroenterology

## 2017-04-28 VITALS — BP 115/72 | HR 78 | Temp 97.3°F | Ht 63.0 in | Wt 241.0 lb

## 2017-04-28 DIAGNOSIS — R197 Diarrhea, unspecified: Secondary | ICD-10-CM

## 2017-04-28 DIAGNOSIS — K5900 Constipation, unspecified: Secondary | ICD-10-CM | POA: Diagnosis not present

## 2017-04-28 DIAGNOSIS — Z800 Family history of malignant neoplasm of digestive organs: Secondary | ICD-10-CM | POA: Diagnosis not present

## 2017-04-28 DIAGNOSIS — R634 Abnormal weight loss: Secondary | ICD-10-CM

## 2017-04-28 DIAGNOSIS — R112 Nausea with vomiting, unspecified: Secondary | ICD-10-CM | POA: Diagnosis not present

## 2017-04-28 DIAGNOSIS — R1013 Epigastric pain: Secondary | ICD-10-CM | POA: Diagnosis not present

## 2017-04-28 MED ORDER — NA SULFATE-K SULFATE-MG SULF 17.5-3.13-1.6 GM/177ML PO SOLN: 1.0000 | ORAL | 0 refills | Status: DC

## 2017-04-28 MED ORDER — LUBIPROSTONE 8 MCG PO CAPS: 8.0000 ug | ORAL_CAPSULE | Freq: Two times a day (BID) | ORAL | 11 refills | Status: DC

## 2017-04-28 NOTE — Telephone Encounter (Signed)
Pre-op scheduled for 06/14/17 at 9:00am. LMOVM. Letter mailed.

## 2017-04-28 NOTE — Progress Notes (Addendum)
REVIEWED-NO ADDITIONAL RECOMMENDATIONS.  Primary Care Physician:  Keane Police, MD  Primary Gastroenterologist:  Barney Drain, MD   Chief Complaint  Patient presents with  . Colonoscopy    consult  . constipation/diarrhea    HPI:  Tammy Love is a 72 y.o. female here to reschedule her colonoscopy and because of alternating constipation/diarrhea. She was scheduled a year ago but cancelled due to illness.   Colonoscopy by Dr. Earley Brooke 2011: mucosa up to terminal ileum appeared normal, biopsies from TI and rectum. Microscopic colitis.   Sister with colon cancer.   Patient complains of intermittent vomiting, several times per month. Will last for 3-4 days. No hematemesis. No heartburn.  Associated with certain foods such as tomatoes and corn.  Happens with others foods as well.  No dysphagia.   She also has alternating constipation and diarrhea.  When has diarrhea, will throw up few times.  Again diarrhea happens several times per month and will last for a few days at a time.  However constipation more predominant and last night had to digitally disimpact, does that quite frequently.  Noted blood on the finger last night with disimpaction.  Epigastric pain and burning. Black stool year ago while in hospital. No procedures. Has used some ibuprofen off/on.  She takes Celebrex every day.  Weight is down 12 pounds in the past year.    Current Outpatient Medications  Medication Sig Dispense Refill  . acetaminophen (TYLENOL) 500 MG tablet Take 1,000 mg by mouth every 6 (six) hours as needed for mild pain or headache.    . albuterol (PROVENTIL HFA;VENTOLIN HFA) 108 (90 Base) MCG/ACT inhaler Inhale 2 puffs into the lungs daily as needed for shortness of breath    . aspirin EC 81 MG tablet Take 81 mg by mouth daily.    Marland Kitchen atorvastatin (LIPITOR) 80 MG tablet Take 80 mg by mouth daily at 6 PM.     . b complex vitamins tablet Take 1 tablet by mouth daily.    . budesonide-formoterol (SYMBICORT) 160-4.5  MCG/ACT inhaler Inhale 2 puffs into the lungs 2 (two) times daily.    . Calcium-Magnesium-Vitamin D (CALCIUM 1200+D3 PO) Take by mouth daily.    . celecoxib (CELEBREX) 200 MG capsule Take 200 mg by mouth 2 (two) times daily.    . fluticasone (FLONASE) 50 MCG/ACT nasal spray Place 2 sprays into both nostrils daily.    . Glucosamine-Chondroitin (GLUCOSAMINE CHONDR COMPLEX PO) Take 1 tablet by mouth daily.    Marland Kitchen ketoconazole (NIZORAL) 2 % cream Apply 1 application topically daily as needed for irritation.    Marland Kitchen lisinopril (PRINIVIL,ZESTRIL) 10 MG tablet Take 10 mg by mouth daily.    . mometasone (NASONEX) 50 MCG/ACT nasal spray Place 2 sprays into the nose daily.    . Multiple Vitamin (MULTIVITAMIN) capsule Take 1 capsule by mouth daily.    Marland Kitchen omeprazole (PRILOSEC) 20 MG capsule Take 20 mg by mouth daily.    . ondansetron (ZOFRAN) 4 MG tablet Take 4 mg by mouth 2 (two) times daily.    . potassium chloride (MICRO-K) 10 MEQ CR capsule Take 10 mEq by mouth daily.     . QUEtiapine (SEROQUEL) 25 MG tablet Take 25 mg by mouth at bedtime.    . sertraline (ZOLOFT) 50 MG tablet Take 50 mg by mouth daily.    . sitaGLIPtin (JANUVIA) 100 MG tablet Take 100 mg by mouth daily.    . sucralfate (CARAFATE) 1 GM/10ML suspension Take 1 g by mouth 4 (  four) times daily -  with meals and at bedtime.    . topiramate (TOPAMAX) 50 MG tablet Take 50 mg by mouth daily. Takes 1 tablet (50 mg) in the morning and 3 tablets (150 mg)at bedtime     . traMADol (ULTRAM) 50 MG tablet Take 50 mg by mouth 2 (two) times daily.      No current facility-administered medications for this visit.     Allergies as of 04/28/2017 - Review Complete 04/28/2017  Allergen Reaction Noted  . Ivp dye [iodinated diagnostic agents] Anaphylaxis, Itching, Swelling, and Rash 03/31/2016  . Amoxicillin-pot clavulanate Itching and Rash 12/15/2009  . Lincocin [lincomycin hcl] Itching and Rash 04/06/2016  . Nitrofurantoin Itching and Rash 12/15/2009  .  Penicillins Itching and Rash 12/15/2009  . Sulfa antibiotics Itching and Rash 12/15/2009  . Sulfamethoxazole-trimethoprim Itching and Rash 12/15/2009  . Sulfasalazine Itching and Rash 12/15/2009    Past Medical History:  Diagnosis Date  . Arthritis   . Asthma   . Bipolar disorder ([place])   . Cancer ([place])    skin cancer on hands  . Diabetes ([place])   . GERD (gastroesophageal reflux disease)   . Headache   . Heart murmur   . Hiatal hernia   . Hypercholesterolemia   . Hypertension   . Sinus disease     Past Surgical History:  Procedure Laterality Date  . ABDOMINAL HYSTERECTOMY    . colonoscopy  2011   Dr. Earley Brooke: mucosa up to terminal ileum appeared normal, biopsies from TI and rectum. MICROSCOPIC COLITIS   . DILATION AND CURETTAGE OF UTERUS    . NASAL SINUS SURGERY    . REPLACEMENT TOTAL KNEE     left  . TONSILLECTOMY    . TUBAL LIGATION      Family History  Problem Relation Age of Onset  . Colon cancer Sister [place] History   Socioeconomic History  . Marital status: Widowed    Spouse name: Not on file  . Number of children: Not on file  . Years of education: Not on file  . Highest education level: Not on file  Occupational History  . Not on file  Social Needs  . Financial resource strain: Not on file  . Food insecurity:    Worry: Not on file    Inability: Not on file  . Transportation needs:    Medical: Not on file    Non-medical: Not on file  Tobacco Use  . Smoking status: Former Smoker    Packs/day: 2.00    Years: 20.00    Pack years: 40.00    Types: Cigarettes    Last attempt to quit: 04/08/2004    Years since quitting: 13.0  . Smokeless tobacco: Never Used  Substance and Sexual Activity  . Alcohol use: No  . Drug use: No  . Sexual activity: Not Currently    Birth control/protection: Surgical  Lifestyle  . Physical activity:    Days per week: Not on file    Minutes per session: Not on file  . Stress: Not on file   Relationships  . Social connections:    Talks on phone: Not on file    Gets together: Not on file    Attends religious service: Not on file    Active member of club or organization: Not on file    Attends meetings of clubs or organizations: Not on file    Relationship status:  Not on file  . Intimate partner violence:    Fear of current or ex partner: Not on file    Emotionally abused: Not on file    Physically abused: Not on file    Forced sexual activity: Not on file  Other Topics Concern  . Not on file  Social History Narrative  . Not on file      ROS:  General: Negative for anorexia, fever, chills, fatigue, weakness.  See HPI Eyes: Negative for vision changes.  ENT: Negative for hoarseness, difficulty swallowing , nasal congestion. CV: Negative for chest pain, angina, palpitations, dyspnea on exertion, peripheral edema.  Respiratory: Negative for dyspnea at rest, dyspnea on exertion, cough, sputum, wheezing.  GI: See history of present illness. GU:  Negative for dysuria, hematuria, urinary incontinence, urinary frequency, nocturnal urination.  MS: Positive for joint pain, low back pain.  Derm: Negative for rash or itching.  Neuro: Negative for weakness, abnormal sensation, seizure, frequent headaches, memory loss, confusion.  Psych: Negative for anxiety, depression, suicidal ideation, hallucinations.  Endo: Negative for unusual weight change.  Heme: Negative for bruising or bleeding. Allergy: Negative for rash or hives.    Physical Examination:  BP 115/72   Pulse 78   Temp (!) 97.3 F (36.3 C) (Oral)   Ht 5\' 3"  (1.6 m)   Wt 241 lb (109.3 kg)   BMI 42.69 kg/m    General: Well-nourished, well-developed in no acute distress.  Head: Normocephalic, atraumatic.   Eyes: Conjunctiva pink, no icterus. Mouth: Oropharyngeal mucosa moist and pink , no lesions erythema or exudate. Neck: Supple without thyromegaly, masses, or lymphadenopathy.  Lungs: Clear to auscultation  bilaterally.  Heart: Regular rate and rhythm, no murmurs rubs or gallops.  Abdomen: Bowel sounds are normal, mild epigastric tenderness, nondistended, no hepatosplenomegaly or masses, no abdominal bruits or    hernia , no rebound or guarding.   Rectal: Not performed Extremities: No lower extremity edema. No clubbing or deformities.  Neuro: Alert and oriented x 4 , grossly normal neurologically.  Skin: Warm and dry, no rash or jaundice.   Psych: Alert and cooperative, normal mood and affect.    Imaging Studies: No results found.

## 2017-04-28 NOTE — Patient Instructions (Signed)
1. Start amitiza 19mcg twice daily with food for constipation. Hold for diarrhea. Samples provided. rx sent to your pharmacy.  2. Colonoscopy and upper endoscopy as scheduled. See separate instructions.

## 2017-05-02 NOTE — Assessment & Plan Note (Signed)
Complains of frequent epigastric burning, nausea and vomiting as outlined above.  Typical reflux well controlled on PPI.  Patient is on chronic Celebrex, takes ibuprofen intermittently as well.  Needs to be evaluated for gastritis, peptic ulcer disease.  Plan for EGD in the near future with deep sedation.  I have discussed the risks, alternatives, benefits with regards to but not limited to the risk of reaction to medication, bleeding, infection, perforation and the patient is agreeable to proceed. Written consent to be obtained.

## 2017-05-02 NOTE — Progress Notes (Signed)
cc'ed to pcp °

## 2017-05-02 NOTE — Assessment & Plan Note (Signed)
Alternating constipation and diarrhea, predominantly constipation requiring digital disimpaction at times.  She wants to go back on a Amitiza 8 mcg twice daily with food.  Previously developed vomiting and diarrhea which may or may not been related, cautiously resume.  She may be able to tolerate however can decrease to once daily if needed.  Intermittent rectal bleeding likely benign anorectal source but she does need a colonoscopy.  Family history positive for colon cancer, sister, diagnosed early 42s.  Anoscopy in the near future.  Deep sedation due to polypharmacy.  I have discussed the risks, alternatives, benefits with regards to but not limited to the risk of reaction to medication, bleeding, infection, perforation and the patient is agreeable to proceed. Written consent to be obtained. Marland Kitchen

## 2017-06-09 NOTE — Patient Instructions (Signed)
Tammy Love  06/09/2017     @PREFPERIOPPHARMACY @   Your procedure is scheduled on   06/21/2017 .  Report to [facility] at  [facility].  Call this number if you have problems the morning of surgery:  [phone]   Remember:  Do not eat food or drink liquids after midnight.  Take these medicines the morning of surgery with A SIP OF WATER  Celebrex, lisinopril, prilosec, zofran, zoloft, topamax, tramdol.   Do not wear jewelry, make-up or nail polish.  Do not wear lotions, powders, or perfumes, or deodorant.  Do not shave 48 hours prior to surgery.  Men may shave face and neck.  Do not bring valuables to the hospital.  [facility] is not responsible for any belongings or valuables.  Contacts, dentures or bridgework may not be worn into surgery.  Leave your suitcase in the car.  After surgery it may be brought to your room.  For patients admitted to the hospital, discharge time will be determined by your treatment team.  Patients discharged the day of surgery will not be allowed to drive home.   Name and phone number of your driver:   family Special instructions:  Follow the diet and prep instructions given to you by Dr Nona Dell office.  Please read over the following fact sheets that you were given. Anesthesia Post-op Instructions and Care and Recovery After Surgery       Esophagogastroduodenoscopy Esophagogastroduodenoscopy (EGD) is a procedure to examine the lining of the esophagus, stomach, and first part of the small intestine (duodenum). This procedure is done to check for problems such as inflammation, bleeding, ulcers, or growths. During this procedure, a long, flexible, lighted tube with a camera attached (endoscope) is inserted down the throat. Tell a health care provider about:  Any allergies you have.  All medicines you are taking, including vitamins, herbs, eye drops, creams, and over-the-counter medicines.  Any problems you or family members  have had with anesthetic medicines.  Any blood disorders you have.  Any surgeries you have had.  Any medical conditions you have.  Whether you are pregnant or may be pregnant. What are the risks? Generally, this is a safe procedure. However, problems may occur, including:  Infection.  Bleeding.  A tear (perforation) in the esophagus, stomach, or duodenum.  Trouble breathing.  Excessive sweating.  Spasms of the larynx.  A slowed heartbeat.  Low blood pressure.  What happens before the procedure?  Follow instructions from your health care provider about eating or drinking restrictions.  Ask your health care provider about: ? Changing or stopping your regular medicines. This is especially important if you are taking diabetes medicines or blood thinners. ? Taking medicines such as aspirin and ibuprofen. These medicines can thin your blood. Do not take these medicines before your procedure if your health care provider instructs you not to.  Plan to have someone take you home after the procedure.  If you wear dentures, be ready to remove them before the procedure. What happens during the procedure?  To reduce your risk of infection, your health care team will wash or sanitize their hands.  An IV tube will be put in a vein in your hand or arm. You will get medicines and fluids through this tube.  You will be given one or more of the following: ? A medicine to help you relax (sedative). ? A medicine to numb  the area (local anesthetic). This medicine may be sprayed into your throat. It will make you feel more comfortable and keep you from gagging or coughing during the procedure. ? A medicine for pain.  A mouth guard may be placed in your mouth to protect your teeth and to keep you from biting on the endoscope.  You will be asked to lie on your left side.  The endoscope will be lowered down your throat into your esophagus, stomach, and duodenum.  Air will be put into  the endoscope. This will help your health care provider see better.  The lining of your esophagus, stomach, and duodenum will be examined.  Your health care provider may: ? Take a tissue sample so it can be looked at in a lab (biopsy). ? Remove growths. ? Remove objects (foreign bodies) that are stuck. ? Treat any bleeding with medicines or other devices that stop tissue from bleeding. ? Widen (dilate) or stretch narrowed areas of your esophagus and stomach.  The endoscope will be taken out. The procedure may vary among health care providers and hospitals. What happens after the procedure?  Your blood pressure, heart rate, breathing rate, and blood oxygen level will be monitored often until the medicines you were given have worn off.  Do not eat or drink anything until the numbing medicine has worn off and your gag reflex has returned. This information is not intended to replace advice given to you by your health care provider. Make sure you discuss any questions you have with your health care provider. Document Released: 05/28/2004 Document Revised: 07/03/2015 Document Reviewed: 12/19/2014 Elsevier Interactive Patient Education  2018 [employer]. Esophagogastroduodenoscopy, Care After Refer to this sheet in the next few weeks. These instructions provide you with information about caring for yourself after your procedure. Your health care provider may also give you more specific instructions. Your treatment has been planned according to current medical practices, but problems sometimes occur. Call your health care provider if you have any problems or questions after your procedure. What can I expect after the procedure? After the procedure, it is common to have:  A sore throat.  Nausea.  Bloating.  Dizziness.  Fatigue.  Follow these instructions at home:  Do not eat or drink anything until the numbing medicine (local anesthetic) has worn off and your gag reflex has returned.  You will know that the local anesthetic has worn off when you can swallow comfortably.  Do not drive for 24 hours if you received a medicine to help you relax (sedative).  If your health care provider took a tissue sample for testing during the procedure, make sure to get your test results. This is your responsibility. Ask your health care provider or the department performing the test when your results will be ready.  Keep all follow-up visits as told by your health care provider. This is important. Contact a health care provider if:  You cannot stop coughing.  You are not urinating.  You are urinating less than usual. Get help right away if:  You have trouble swallowing.  You cannot eat or drink.  You have throat or chest pain that gets worse.  You are dizzy or light-headed.  You faint.  You have nausea or vomiting.  You have chills.  You have a fever.  You have severe abdominal pain.  You have black, tarry, or bloody stools. This information is not intended to replace advice given to you by your health care provider. Make sure  you discuss any questions you have with your health care provider. Document Released: 01/12/2012 Document Revised: 07/03/2015 Document Reviewed: 12/19/2014 Elsevier Interactive Patient Education  2018 [employer].  Colonoscopy, Adult A colonoscopy is an exam to look at the large intestine. It is done to check for problems, such as:  Lumps (tumors).  Growths (polyps).  Swelling (inflammation).  Bleeding.  What happens before the procedure? Eating and drinking Follow instructions from your doctor about eating and drinking. These instructions may include:  A few days before the procedure - follow a low-fiber diet. ? Avoid nuts. ? Avoid seeds. ? Avoid dried fruit. ? Avoid raw fruits. ? Avoid vegetables.  1-3 days before the procedure - follow a clear liquid diet. Avoid liquids that have red or purple dye. Drink only clear liquids,  such as: ? Clear broth or bouillon. ? Black coffee or tea. ? Clear juice. ? Clear soft drinks or sports drinks. ? Gelatin dessert. ? Popsicles.  On the day of the procedure - do not eat or drink anything during the 2 hours before the procedure.  Bowel prep If you were prescribed an oral bowel prep:  Take it as told by your doctor. Starting the day before your procedure, you will need to drink a lot of liquid. The liquid will cause you to poop (have bowel movements) until your poop is almost clear or light green.  If your skin or butt gets irritated from diarrhea, you may: ? Wipe the area with wipes that have medicine in them, such as adult wet wipes with aloe and vitamin E. ? Put something on your skin that soothes the area, such as petroleum jelly.  If you throw up (vomit) while drinking the bowel prep, take a break for up to 60 minutes. Then begin the bowel prep again. If you keep throwing up and you cannot take the bowel prep without throwing up, call your doctor.  General instructions  Ask your doctor about changing or stopping your normal medicines. This is important if you take diabetes medicines or blood thinners.  Plan to have someone take you home from the hospital or clinic. What happens during the procedure?  An IV tube may be put into one of your veins.  You will be given medicine to help you relax (sedative).  To reduce your risk of infection: ? Your doctors will wash their hands. ? Your anal area will be washed with soap.  You will be asked to lie on your side with your knees bent.  Your doctor will get a long, thin, flexible tube ready. The tube will have a camera and a light on the end.  The tube will be put into your anus.  The tube will be gently put into your large intestine.  Air will be delivered into your large intestine to keep it open. You may feel some pressure or cramping.  The camera will be used to take photos.  A small tissue sample may be  removed from your body to be looked at under a microscope (biopsy). If any possible problems are found, the tissue will be sent to a lab for testing.  If small growths are found, your doctor may remove them and have them checked for cancer.  The tube that was put into your anus will be slowly removed. The procedure may vary among doctors and hospitals. What happens after the procedure?  Your doctor will check on you often until the medicines you were given have worn off.  Do not drive for 24 hours after the procedure.  You may have a small amount of blood in your poop.  You may pass gas.  You may have mild cramps or bloating in your belly (abdomen).  It is up to you to get the results of your procedure. Ask your doctor, or the department performing the procedure, when your results will be ready. This information is not intended to replace advice given to you by your health care provider. Make sure you discuss any questions you have with your health care provider. Document Released: 02/27/2010 Document Revised: 11/26/2015 Document Reviewed: 04/08/2015 Elsevier Interactive Patient Education  2017 [employer]  Colonoscopy, Adult, Care After This sheet gives you information about how to care for yourself after your procedure. Your health care provider may also give you more specific instructions. If you have problems or questions, contact your health care provider. What can I expect after the procedure? After the procedure, it is common to have:  A small amount of blood in your stool for 24 hours after the procedure.  Some gas.  Mild abdominal cramping or bloating.  Follow these instructions at home: General instructions   For the first 24 hours after the procedure: ? Do not drive or use machinery. ? Do not sign important documents. ? Do not drink alcohol. ? Do your regular daily activities at a slower pace than normal. ? Eat soft, easy-to-digest foods. ? Rest  often.  Take over-the-counter or prescription medicines only as told by your health care provider.  It is up to you to get the results of your procedure. Ask your health care provider, or the department performing the procedure, when your results will be ready. Relieving cramping and bloating  Try walking around when you have cramps or feel bloated.  Apply heat to your abdomen as told by your health care provider. Use a heat source that your health care provider recommends, such as a moist heat pack or a heating pad. ? Place a towel between your skin and the heat source. ? Leave the heat on for 20-30 minutes. ? Remove the heat if your skin turns bright red. This is especially important if you are unable to feel pain, heat, or cold. You may have a greater risk of getting burned. Eating and drinking  Drink enough fluid to keep your urine clear or pale yellow.  Resume your normal diet as instructed by your health care provider. Avoid heavy or fried foods that are hard to digest.  Avoid drinking alcohol for as long as instructed by your health care provider. Contact a health care provider if:  You have blood in your stool 2-3 days after the procedure. Get help right away if:  You have more than a small spotting of blood in your stool.  You pass large blood clots in your stool.  Your abdomen is swollen.  You have nausea or vomiting.  You have a fever.  You have increasing abdominal pain that is not relieved with medicine. This information is not intended to replace advice given to you by your health care provider. Make sure you discuss any questions you have with your health care provider. Document Released: 09/09/2003 Document Revised: 10/20/2015 Document Reviewed: 04/08/2015 Elsevier Interactive Patient Education  2018 [place] Anesthesia is a term that refers to techniques, procedures, and medicines that help a person stay safe and comfortable  during a medical procedure. Monitored anesthesia care, or sedation, is one type of  anesthesia. Your anesthesia specialist may recommend sedation if you will be having a procedure that does not require you to be unconscious, such as:  Cataract surgery.  A dental procedure.  A biopsy.  A colonoscopy.  During the procedure, you may receive a medicine to help you relax (sedative). There are three levels of sedation:  Mild sedation. At this level, you may feel awake and relaxed. You will be able to follow directions.  Moderate sedation. At this level, you will be sleepy. You may not remember the procedure.  Deep sedation. At this level, you will be asleep. You will not remember the procedure.  The more medicine you are given, the deeper your level of sedation will be. Depending on how you respond to the procedure, the anesthesia specialist may change your level of sedation or the type of anesthesia to fit your needs. An anesthesia specialist will monitor you closely during the procedure. Let your health care provider know about:  Any allergies you have.  All medicines you are taking, including vitamins, herbs, eye drops, creams, and over-the-counter medicines.  Any use of steroids (by mouth or as a cream).  Any problems you or family members have had with sedatives and anesthetic medicines.  Any blood disorders you have.  Any surgeries you have had.  Any medical conditions you have, such as sleep apnea.  Whether you are pregnant or may be pregnant.  Any use of cigarettes, alcohol, or street drugs. What are the risks? Generally, this is a safe procedure. However, problems may occur, including:  Getting too much medicine (oversedation).  Nausea.  Allergic reaction to medicines.  Trouble breathing. If this happens, a breathing tube may be used to help with breathing. It will be removed when you are awake and breathing on your own.  Heart trouble.  Lung trouble.  Before  the procedure Staying hydrated Follow instructions from your health care provider about hydration, which may include:  Up to 2 hours before the procedure - you may continue to drink clear liquids, such as water, clear fruit juice, black coffee, and plain tea.  Eating and drinking restrictions Follow instructions from your health care provider about eating and drinking, which may include:  8 hours before the procedure - stop eating heavy meals or foods such as meat, fried foods, or fatty foods.  6 hours before the procedure - stop eating light meals or foods, such as toast or cereal.  6 hours before the procedure - stop drinking milk or drinks that contain milk.  2 hours before the procedure - stop drinking clear liquids.  Medicines Ask your health care provider about:  Changing or stopping your regular medicines. This is especially important if you are taking diabetes medicines or blood thinners.  Taking medicines such as aspirin and ibuprofen. These medicines can thin your blood. Do not take these medicines before your procedure if your health care provider instructs you not to.  Tests and exams  You will have a physical exam.  You may have blood tests done to show: ? How well your kidneys and liver are working. ? How well your blood can clot.  General instructions  Plan to have someone take you home from the hospital or clinic.  If you will be going home right after the procedure, plan to have someone with you for 24 hours.  What happens during the procedure?  Your blood pressure, heart rate, breathing, level of pain and overall condition will be monitored.  An IV  tube will be inserted into one of your veins.  Your anesthesia specialist will give you medicines as needed to keep you comfortable during the procedure. This may mean changing the level of sedation.  The procedure will be performed. After the procedure  Your blood pressure, heart rate, breathing rate, and  blood oxygen level will be monitored until the medicines you were given have worn off.  Do not drive for 24 hours if you received a sedative.  You may: ? Feel sleepy, clumsy, or nauseous. ? Feel forgetful about what happened after the procedure. ? Have a sore throat if you had a breathing tube during the procedure. ? Vomit. This information is not intended to replace advice given to you by your health care provider. Make sure you discuss any questions you have with your health care provider. Document Released: 10/21/2004 Document Revised: 07/04/2015 Document Reviewed: 05/18/2015 Elsevier Interactive Patient Education  2018 [place], Care After These instructions provide you with information about caring for yourself after your procedure. Your health care provider may also give you more specific instructions. Your treatment has been planned according to current medical practices, but problems sometimes occur. Call your health care provider if you have any problems or questions after your procedure. What can I expect after the procedure? After your procedure, it is common to:  Feel sleepy for several hours.  Feel clumsy and have poor balance for several hours.  Feel forgetful about what happened after the procedure.  Have poor judgment for several hours.  Feel nauseous or vomit.  Have a sore throat if you had a breathing tube during the procedure.  Follow these instructions at home: For at least 24 hours after the procedure:   Do not: ? Participate in activities in which you could fall or become injured. ? Drive. ? Use heavy machinery. ? Drink alcohol. ? Take sleeping pills or medicines that cause drowsiness. ? Make important decisions or sign legal documents. ? Take care of children on your own.  Rest. Eating and drinking  Follow the diet that is recommended by your health care provider.  If you vomit, drink water, juice, or soup when you  can drink without vomiting.  Make sure you have little or no nausea before eating solid foods. General instructions  Have a responsible adult stay with you until you are awake and alert.  Take over-the-counter and prescription medicines only as told by your health care provider.  If you smoke, do not smoke without supervision.  Keep all follow-up visits as told by your health care provider. This is important. Contact a health care provider if:  You keep feeling nauseous or you keep vomiting.  You feel light-headed.  You develop a rash.  You have a fever. Get help right away if:  You have trouble breathing. This information is not intended to replace advice given to you by your health care provider. Make sure you discuss any questions you have with your health care provider. Document Released: 05/18/2015 Document Revised: 09/17/2015 Document Reviewed: 05/18/2015 Elsevier Interactive Patient Education  [employer].

## 2017-06-14 ENCOUNTER — Encounter (HOSPITAL_COMMUNITY)
Admission: RE | Admit: 2017-06-14 | Discharge: 2017-06-14 | Disposition: A | Discharge status: Home / Self Care | Payer: Medicare Other | Source: Ambulatory Visit | Attending: Gastroenterology | Admitting: Gastroenterology

## 2017-06-14 ENCOUNTER — Encounter (HOSPITAL_COMMUNITY): Payer: Self-pay

## 2017-06-14 ENCOUNTER — Other Ambulatory Visit: Payer: Self-pay

## 2017-06-14 DIAGNOSIS — R9431 Abnormal electrocardiogram [ECG] [EKG]: Secondary | ICD-10-CM | POA: Diagnosis not present

## 2017-06-14 DIAGNOSIS — Z01812 Encounter for preprocedural laboratory examination: Secondary | ICD-10-CM | POA: Insufficient documentation

## 2017-06-14 DIAGNOSIS — Z01810 Encounter for preprocedural cardiovascular examination: Secondary | ICD-10-CM | POA: Diagnosis not present

## 2017-06-14 HISTORY — DX: Unspecified glaucoma: H40.9

## 2017-06-14 LAB — CBC WITH DIFFERENTIAL/PLATELET
Basophils Absolute: 0 10*3/uL (ref 0.0–0.1)
Basophils Relative: 0 %
Eosinophils Absolute: 0.3 10*3/uL (ref 0.0–0.7)
Eosinophils Relative: 3 %
HEMATOCRIT: 40.8 % (ref 36.0–46.0)
HEMOGLOBIN: 13 g/dL (ref 12.0–15.0)
LYMPHS ABS: 2.4 10*3/uL (ref 0.7–4.0)
LYMPHS PCT: 26 %
MCH: 29 pg (ref 26.0–34.0)
MCHC: 31.9 g/dL (ref 30.0–36.0)
MCV: 91.1 fL (ref 78.0–100.0)
Monocytes Absolute: 0.6 10*3/uL (ref 0.1–1.0)
Monocytes Relative: 7 %
NEUTROS ABS: 6 10*3/uL (ref 1.7–7.7)
NEUTROS PCT: 65 %
Platelets: 240 10*3/uL (ref 150–400)
RBC: 4.48 MIL/uL (ref 3.87–5.11)
RDW: 13.9 % (ref 11.5–15.5)
WBC: 9.2 10*3/uL (ref 4.0–10.5)

## 2017-06-14 LAB — BASIC METABOLIC PANEL
Anion gap: 6 (ref 5–15)
BUN: 22 mg/dL — ABNORMAL HIGH (ref 6–20)
CHLORIDE: 111 mmol/L (ref 101–111)
CO2: 25 mmol/L (ref 22–32)
CREATININE: 0.84 mg/dL (ref 0.44–1.00)
Calcium: 9.5 mg/dL (ref 8.9–10.3)
GFR calc Af Amer: >60 mL/min (ref >60)
Glucose, Bld: 102 mg/dL — ABNORMAL HIGH (ref 65–99)
POTASSIUM: 4 mmol/L (ref 3.5–5.1)
SODIUM: 142 mmol/L (ref 135–145)

## 2017-06-14 NOTE — Progress Notes (Signed)
CC'D TO PCP °

## 2017-06-16 NOTE — Progress Notes (Signed)
LMOM to call.

## 2017-06-16 NOTE — Progress Notes (Signed)
CC'D TO PCP °

## 2017-06-20 ENCOUNTER — Telehealth: Payer: Self-pay

## 2017-06-20 NOTE — Telephone Encounter (Signed)
Noted. Please put her on list to follow up on cardiac clearance so she can be rescheduled when appropriate.

## 2017-06-20 NOTE — Progress Notes (Signed)
Pt is aware and has appt with Dr. Harl Bowie on 06/23/2017.

## 2017-06-20 NOTE — Telephone Encounter (Signed)
Pt called office after hours, LMOVM asking if she will still be having colonoscopy since she has to see Dr. Harl Bowie.  Reviewed pt's chart, TCS/EGD w/Propofol scheduled with [facility] tomorrow. She had abnormal EKG at pre-op appt, Dr. Oneida Alar reviewed EKG with Dr. Harl Bowie. Per pt's appt desk, appt with Dr. Harl Bowie scheduled for 06/23/17 for new pt appt for abnormal ekg, cardiac clearance per staff message. [place] and spoke to [place]. She advised pt needs cardiac clearance per Dr. Harl Bowie. Called and informed pt she will need cardiac clearance before having procedure therefore, tomorrow's procedure will need to be cancelled until seen by cardiology. Called and informed Endo scheduler to cancel procedure.

## 2017-06-21 ENCOUNTER — Encounter (HOSPITAL_COMMUNITY): Admission: RE | Payer: Self-pay | Source: Ambulatory Visit

## 2017-06-21 ENCOUNTER — Ambulatory Visit (HOSPITAL_COMMUNITY): Admission: RE | Admit: 2017-06-21 | Payer: Medicare Other | Source: Ambulatory Visit | Admitting: Gastroenterology

## 2017-06-21 SURGERY — COLONOSCOPY WITH PROPOFOL
Anesthesia: Monitor Anesthesia Care

## 2017-06-21 NOTE — Telephone Encounter (Signed)
REVIEWED-NO ADDITIONAL RECOMMENDATIONS. 

## 2017-06-23 ENCOUNTER — Encounter: Payer: Self-pay | Admitting: Cardiology

## 2017-06-23 ENCOUNTER — Ambulatory Visit (INDEPENDENT_AMBULATORY_CARE_PROVIDER_SITE_OTHER): Payer: Medicare Other | Admitting: Cardiology

## 2017-06-23 VITALS — BP 88/60 | HR 82 | Ht 63.0 in | Wt 233.0 lb

## 2017-06-23 DIAGNOSIS — R0602 Shortness of breath: Secondary | ICD-10-CM | POA: Diagnosis not present

## 2017-06-23 DIAGNOSIS — R079 Chest pain, unspecified: Secondary | ICD-10-CM

## 2017-06-23 DIAGNOSIS — R011 Cardiac murmur, unspecified: Secondary | ICD-10-CM

## 2017-06-23 DIAGNOSIS — R9431 Abnormal electrocardiogram [ECG] [EKG]: Secondary | ICD-10-CM | POA: Diagnosis not present

## 2017-06-23 NOTE — Patient Instructions (Addendum)
Medication Instructions:  Your physician recommends that you continue on your current medications as directed. Please refer to the Current Medication list given to you today.   Labwork: NONE  Testing/Procedures: Your physician has requested that you have an echocardiogram. Echocardiography is a painless test that uses sound waves to create images of your heart. It provides your doctor with information about the size and shape of your heart and how well your heart's chambers and valves are working. This procedure takes approximately one hour. There are no restrictions for this procedure.    Follow-Up: Your physician recommends that you schedule a follow-up appointment in: 1 MONTH    Any Other Special Instructions Will Be Listed Below (If Applicable).     If you need a refill on your cardiac medications before your next appointment, please call your pharmacy.   

## 2017-06-23 NOTE — Progress Notes (Signed)
Clinical Summary Tammy Love is a 72 y.o.female seen as new consult, referred by Dr Oneida Alar for abnormal EKG   1. Abnormal EKG - EKG reviewed independently, SR with LAFB, anteroseptal Qwaves - no known history of CAD  - can have some chest pain at times about 1 month - left side, nonspecific pain. Would last about 15 minutes. 2/10 in severity. No other associated symptoms. Not positional. 4-5 episodes around that time. No episodes over the last month - has had some SOB/DOE. She relates to her asthma. For example walking into parking lot made SOB which is stable.  - mild LE edema at times, stomach.  CAD risk factors: DM2, HL, HTN, former smoker x 20 years.  - cannot run on treadmill due knee pain.   2. Abdominal pain - upcoming EGD and colonscopy for abdominal pain, N/V/D  3. Heart murmur - reports she was told several year ago. Unsure of any other details.   Past Medical History:  Diagnosis Date  . Arthritis   . Asthma   . Bipolar disorder ([place])   . Cancer ([place])    skin cancer on hands  . Diabetes ([place])   . GERD (gastroesophageal reflux disease)   . Glaucoma   . Headache   . Heart murmur   . Hiatal hernia   . Hypercholesterolemia   . Hypertension   . Sinus disease      Allergies  Allergen Reactions  . Ivp Dye [Iodinated Diagnostic Agents] Anaphylaxis, Itching, Swelling and Rash  . Amoxicillin-Pot Clavulanate Itching and Rash    Has patient had a PCN reaction causing immediate rash, facial/tongue/throat swelling, SOB or lightheadedness with hypotension: unknown Has patient had a PCN reaction causing severe rash involving mucus membranes or skin necrosis: No Has patient had a PCN reaction that required hospitalization No Has patient had a PCN reaction occurring within the last 10 years: No If all of the above answers are "NO", then may proceed with Cephalosporin use.   . Lincocin [Lincomycin Hcl] Itching and Rash  . Nitrofurantoin Itching and Rash  .  Penicillins Itching and Rash    Has patient had a PCN reaction causing immediate rash, facial/tongue/throat swelling, SOB or lightheadedness with hypotension: unknown Has patient had a PCN reaction causing severe rash involving mucus membranes or skin necrosis: No Has patient had a PCN reaction that required hospitalization No Has patient had a PCN reaction occurring within the last 10 years: No If all of the above answers are "NO", then may proceed with Cephalosporin use.   . Sulfa Antibiotics Itching and Rash  . Sulfamethoxazole-Trimethoprim Itching and Rash  . Sulfasalazine Itching and Rash     Current Outpatient Medications  Medication Sig Dispense Refill  . acetaminophen (TYLENOL) 500 MG tablet Take 1,000 mg by mouth every 6 (six) hours as needed for mild pain or headache.    . albuterol (PROVENTIL HFA;VENTOLIN HFA) 108 (90 Base) MCG/ACT inhaler Inhale 2 puffs into the lungs every 6 (six) hours as needed for wheezing or shortness of breath.    Marland Kitchen aspirin EC 81 MG tablet Take 81 mg by mouth daily.    Marland Kitchen atorvastatin (LIPITOR) 80 MG tablet Take 80 mg by mouth at bedtime.     . Calcium-Magnesium-Zinc (CAL-MAG-ZINC PO) Take 1 tablet by mouth 2 (two) times daily.    . celecoxib (CELEBREX) 200 MG capsule Take 200 mg by mouth daily with breakfast.     . Cholecalciferol (VITAMIN D3) 2000 units TABS Take 2,000  Units by mouth daily.    . fluticasone (FLONASE) 50 MCG/ACT nasal spray Place 2 sprays into both nostrils daily.    . Folic Acid-Vit S8-NIO E70 (B COMPLEX-FOLIC ACID PO) Take 1 tablet by mouth daily.    . Glucosamine-Chondroitin (GLUCOSAMINE CHONDR COMPLEX PO) Take 1 tablet by mouth 2 (two) times daily.     Marland Kitchen ketoconazole (NIZORAL) 2 % cream Apply 1 application topically daily as needed for irritation (foot athlete's foot/itching).     Marland Kitchen lisinopril (PRINIVIL,ZESTRIL) 10 MG tablet Take 10 mg by mouth daily.    Marland Kitchen lubiprostone (AMITIZA) 8 MCG capsule Take 1 capsule (8 mcg total) by mouth 2  (two) times daily with a meal. (Patient taking differently: Take 8 mcg by mouth 2 (two) times daily as needed (for constipation.). ) 60 capsule 11  . mometasone (NASONEX) 50 MCG/ACT nasal spray Place 2 sprays into the nose daily as needed (for allergies.).     Marland Kitchen Multiple Vitamin (MULTIVITAMIN WITH MINERALS) TABS tablet Take 1 tablet by mouth daily. Centrum Silver    . Na Sulfate-K Sulfate-Mg Sulf 17.5-3.13-1.6 GM/177ML SOLN Take 1 kit by mouth as directed. 1 Bottle 0  . Omega-3 Fatty Acids (FISH OIL) 1200 MG CAPS Take 1,200 mg by mouth daily. With Omega-3 360 mg    . omeprazole (PRILOSEC) 20 MG capsule Take 20 mg by mouth daily.    . ondansetron (ZOFRAN-ODT) 8 MG disintegrating tablet Take 8 mg by mouth every 8 (eight) hours as needed for nausea or vomiting.    . potassium chloride (MICRO-K) 10 MEQ CR capsule Take 10 mEq by mouth daily.     . QUEtiapine (SEROQUEL) 25 MG tablet Take 25 mg by mouth at bedtime.    . sertraline (ZOLOFT) 50 MG tablet Take 50 mg by mouth at bedtime.     . sitaGLIPtin (JANUVIA) 100 MG tablet Take 100 mg by mouth daily.    . sucralfate (CARAFATE) 1 GM/10ML suspension Take 1 g by mouth 4 (four) times daily as needed (for indigestion.).     Marland Kitchen SYMBICORT 80-4.5 MCG/ACT inhaler Inhale 2 puffs into the lungs daily.     Marland Kitchen topiramate (TOPAMAX) 100 MG tablet Take 100-300 mg by mouth See admin instructions. Take 1 tablet (100 mg) by mouth in the morning, and 3 tablet (300 mg) by mouth at bedtime    . traMADol (ULTRAM) 50 MG tablet Take 50 mg by mouth every 12 (twelve) hours as needed (for pain.).      No current facility-administered medications for this visit.      Past Surgical History:  Procedure Laterality Date  . ABDOMINAL HYSTERECTOMY    . colonoscopy  2011   Dr. Earley Brooke: mucosa up to terminal ileum appeared normal, biopsies from TI and rectum. MICROSCOPIC COLITIS   . DILATION AND CURETTAGE OF UTERUS     x3  . NASAL SINUS SURGERY    . REPLACEMENT TOTAL KNEE     left    . TONSILLECTOMY    . TUBAL LIGATION       Allergies  Allergen Reactions  . Ivp Dye [Iodinated Diagnostic Agents] Anaphylaxis, Itching, Swelling and Rash  . Amoxicillin-Pot Clavulanate Itching and Rash    Has patient had a PCN reaction causing immediate rash, facial/tongue/throat swelling, SOB or lightheadedness with hypotension: unknown Has patient had a PCN reaction causing severe rash involving mucus membranes or skin necrosis: No Has patient had a PCN reaction that required hospitalization No Has patient had a PCN reaction occurring within  the last 10 years: No If all of the above answers are "NO", then may proceed with Cephalosporin use.   . Lincocin [Lincomycin Hcl] Itching and Rash  . Nitrofurantoin Itching and Rash  . Penicillins Itching and Rash    Has patient had a PCN reaction causing immediate rash, facial/tongue/throat swelling, SOB or lightheadedness with hypotension: unknown Has patient had a PCN reaction causing severe rash involving mucus membranes or skin necrosis: No Has patient had a PCN reaction that required hospitalization No Has patient had a PCN reaction occurring within the last 10 years: No If all of the above answers are "NO", then may proceed with Cephalosporin use.   . Sulfa Antibiotics Itching and Rash  . Sulfamethoxazole-Trimethoprim Itching and Rash  . Sulfasalazine Itching and Rash      Family History  Problem Relation Age of Onset  . Colon cancer Sister [place] History Ms. Palermo reports that she quit smoking about 13 years ago. Her smoking use included cigarettes. She has a 40.00 pack-year smoking history. She has never used smokeless tobacco. Ms. Vinas reports that she does not drink alcohol.   Review of Systems CONSTITUTIONAL: No weight loss, fever, chills, weakness or fatigue.  HEENT: Eyes: No visual loss, blurred vision, double vision or yellow sclerae.No hearing loss, sneezing, congestion, runny nose or  sore throat.  SKIN: No rash or itching.  CARDIOVASCULAR: per hpi RESPIRATORY: per hpi  GASTROINTESTINAL: per hpi  GENITOURINARY: No burning on urination, no polyuria NEUROLOGICAL: No headache, dizziness, syncope, paralysis, ataxia, numbness or tingling in the extremities. No change in bowel or bladder control.  MUSCULOSKELETAL: No muscle, back pain, joint pain or stiffness.  LYMPHATICS: No enlarged nodes. No history of splenectomy.  PSYCHIATRIC: No history of depression or anxiety.  ENDOCRINOLOGIC: No reports of sweating, cold or heat intolerance. No polyuria or polydipsia.  Marland Kitchen   Physical Examination Vitals:   06/23/17 1354 06/23/17 1404  BP: 102/62 (!) 88/60  Pulse: 82   SpO2: 98%    Vitals:   06/23/17 1354  Weight: 233 lb (105.7 kg)  Height: '5\' 3"'$  (1.6 m)    Gen: resting comfortably, no acute distress HEENT: no scleral icterus, pupils equal round and reactive, no palptable cervical adenopathy,  CV: RRR, 2/6 systolic murmur rusb Resp: Clear to auscultation bilaterally GI: abdomen is soft, non-tender, non-distended, normal bowel sounds, no hepatosplenomegaly MSK: extremities are warm, no edema.  Skin: warm, no rash Neuro:  no focal deficits Psych: appropriate affect      Assessment and Plan  1. Chest pain/SOB - multiople CAD risk factors. EKG with anteroseptal Qwaves - plan for echo, pending results likely obtain lexiscan  2. Heart murmur - obtain echo   F/u 1 month     Arnoldo Lenis, M.D.

## 2017-06-29 ENCOUNTER — Encounter: Payer: Self-pay | Admitting: Cardiology

## 2017-06-29 ENCOUNTER — Other Ambulatory Visit (HOSPITAL_COMMUNITY): Payer: Medicare Other

## 2017-07-01 ENCOUNTER — Ambulatory Visit (HOSPITAL_COMMUNITY)
Admission: RE | Admit: 2017-07-01 | Discharge: 2017-07-01 | Disposition: A | Discharge status: Home / Self Care | Payer: Medicare Other | Source: Ambulatory Visit | Attending: Cardiology | Admitting: Cardiology

## 2017-07-01 DIAGNOSIS — R011 Cardiac murmur, unspecified: Secondary | ICD-10-CM | POA: Insufficient documentation

## 2017-07-01 DIAGNOSIS — I10 Essential (primary) hypertension: Secondary | ICD-10-CM | POA: Insufficient documentation

## 2017-07-01 DIAGNOSIS — K219 Gastro-esophageal reflux disease without esophagitis: Secondary | ICD-10-CM | POA: Diagnosis not present

## 2017-07-01 DIAGNOSIS — I351 Nonrheumatic aortic (valve) insufficiency: Secondary | ICD-10-CM | POA: Insufficient documentation

## 2017-07-01 DIAGNOSIS — E119 Type 2 diabetes mellitus without complications: Secondary | ICD-10-CM | POA: Insufficient documentation

## 2017-07-01 DIAGNOSIS — R079 Chest pain, unspecified: Secondary | ICD-10-CM | POA: Diagnosis present

## 2017-07-01 DIAGNOSIS — E785 Hyperlipidemia, unspecified: Secondary | ICD-10-CM | POA: Diagnosis not present

## 2017-07-01 DIAGNOSIS — Z87891 Personal history of nicotine dependence: Secondary | ICD-10-CM | POA: Insufficient documentation

## 2017-07-01 NOTE — Progress Notes (Signed)
*  PRELIMINARY RESULTS* Echocardiogram echo has been performed.  Leavy Cella 07/01/2017, 2:06 PM

## 2017-07-06 NOTE — Telephone Encounter (Signed)
Per Dr. Nelly Laurence note: plan for echo, pending results likely obtain lexiscan. Pt had echo, further pending.

## 2017-07-12 NOTE — Telephone Encounter (Addendum)
According to pt's appt desk, she has Cardiology appt 07/26/17.

## 2017-07-22 NOTE — Progress Notes (Signed)
Cardiology Office Note    Date:  07/26/2017   ID:  Tammy Love, DOB 09-Dec-1945, MRN [id number]  PCP:  Keane Police, MD  Cardiologist: Carlyle Dolly, MD    Chief Complaint  Patient presents with  . Follow-up    1 month visit; s/p echocardiogram    History of Present Illness:    Tammy Love is a 72 y.o. female with past medical history of HTN, HLD, Type II DM, and GERD who presents to the office today for one-month follow-up.  She was examined by Dr. Harl Bowie on 06/23/2017 as a new patient referral for evaluation of episodes of chest pain and an abnormal EKG. A recent EKG by her PCP had shown normal sinus rhythm with LAFB and anterior septal Q-waves. She also reported having an episodes of chest pain for the past few months which would last for about 15 minutes and spontaneously resolve. Given her multiple risk factors and abnormal EKG, an echocardiogram was obtained for initial evaluation with consideration of a Lexiscan pending results.  Her echocardiogram was performed on 07/01/2017 and showed a preserved EF of 55 to 60%, no regional wall motion abnormalities, mild AI, and trivial TR.  In talking with the patient today, she reports overall doing well from a cardiac perspective since her last office visit. She did experience one episode of discomfort underneath her left breast approximately 2 weeks ago which occurred while walking to the restroom and resolved when she had a bowel movement. Denies any episodes of exertional chest pain. Has baseline dyspnea on exertion but denies any acute changes in her symptoms. No recent orthopnea, PND, or lower extremity edema.  Her main concern today is that she continues to experience intermittent episodes of diarrhea and constipation. Her sister was diagnosed with colon cancer last year and she is anxious to have a colonoscopy as previously planned by Dr. Oneida Alar.  Past Medical History:  Diagnosis Date  . Arthritis   . Asthma   . Bipolar disorder  ([place])   . Cancer ([place])    skin cancer on hands  . Diabetes ([place])   . GERD (gastroesophageal reflux disease)   . Glaucoma   . Headache   . Heart murmur   . Hiatal hernia   . Hypercholesterolemia   . Hypertension   . Sinus disease     Past Surgical History:  Procedure Laterality Date  . ABDOMINAL HYSTERECTOMY    . colonoscopy  2011   Dr. Earley Brooke: mucosa up to terminal ileum appeared normal, biopsies from TI and rectum. MICROSCOPIC COLITIS   . DILATION AND CURETTAGE OF UTERUS     x3  . NASAL SINUS SURGERY    . REPLACEMENT TOTAL KNEE     left  . TONSILLECTOMY    . TUBAL LIGATION      Current Medications: Outpatient Medications Prior to Visit  Medication Sig Dispense Refill  . acetaminophen (TYLENOL) 500 MG tablet Take 1,000 mg by mouth every 6 (six) hours as needed for mild pain or headache.    . albuterol (PROVENTIL HFA;VENTOLIN HFA) 108 (90 Base) MCG/ACT inhaler Inhale 2 puffs into the lungs every 6 (six) hours as needed for wheezing or shortness of breath.    Marland Kitchen aspirin EC 81 MG tablet Take 81 mg by mouth daily.    Marland Kitchen atorvastatin (LIPITOR) 80 MG tablet Take 80 mg by mouth at bedtime.     . Calcium-Magnesium-Zinc (CAL-MAG-ZINC PO) Take 1 tablet by mouth 2 (two) times daily.    . celecoxib (  CELEBREX) 200 MG capsule Take 200 mg by mouth daily with breakfast.     . Cholecalciferol (VITAMIN D3) 2000 units TABS Take 2,000 Units by mouth daily.    . fluticasone (FLONASE) 50 MCG/ACT nasal spray Place 2 sprays into both nostrils daily.    . Folic Acid-Vit Y7-WGN F62 (B COMPLEX-FOLIC ACID PO) Take 1 tablet by mouth daily.    . Glucosamine-Chondroitin (GLUCOSAMINE CHONDR COMPLEX PO) Take 1 tablet by mouth 2 (two) times daily.     Marland Kitchen ketoconazole (NIZORAL) 2 % cream Apply 1 application topically daily as needed for irritation (foot athlete's foot/itching).     Marland Kitchen lisinopril (PRINIVIL,ZESTRIL) 10 MG tablet Take 10 mg by mouth daily.    Marland Kitchen lubiprostone (AMITIZA) 8 MCG capsule Take 1 capsule (8  mcg total) by mouth 2 (two) times daily with a meal. (Patient taking differently: Take 8 mcg by mouth 2 (two) times daily as needed (for constipation.). ) 60 capsule 11  . mometasone (NASONEX) 50 MCG/ACT nasal spray Place 2 sprays into the nose daily as needed (for allergies.).     Marland Kitchen Multiple Vitamin (MULTIVITAMIN WITH MINERALS) TABS tablet Take 1 tablet by mouth daily. Centrum Silver    . Na Sulfate-K Sulfate-Mg Sulf 17.5-3.13-1.6 GM/177ML SOLN Take 1 kit by mouth as directed. 1 Bottle 0  . Omega-3 Fatty Acids (FISH OIL) 1200 MG CAPS Take 1,200 mg by mouth daily. With Omega-3 360 mg    . omeprazole (PRILOSEC) 20 MG capsule Take 20 mg by mouth daily.    . ondansetron (ZOFRAN-ODT) 8 MG disintegrating tablet Take 8 mg by mouth every 8 (eight) hours as needed for nausea or vomiting.    . potassium chloride (MICRO-K) 10 MEQ CR capsule Take 10 mEq by mouth daily.     . QUEtiapine (SEROQUEL) 25 MG tablet Take 25 mg by mouth at bedtime.    . sertraline (ZOLOFT) 50 MG tablet Take 50 mg by mouth at bedtime.     . sitaGLIPtin (JANUVIA) 100 MG tablet Take 100 mg by mouth daily.    . sucralfate (CARAFATE) 1 GM/10ML suspension Take 1 g by mouth 4 (four) times daily as needed (for indigestion.).     Marland Kitchen SYMBICORT 80-4.5 MCG/ACT inhaler Inhale 2 puffs into the lungs daily.     Marland Kitchen topiramate (TOPAMAX) 100 MG tablet Take 100-300 mg by mouth See admin instructions. Take 1 tablet (100 mg) by mouth in the morning, and 3 tablet (300 mg) by mouth at bedtime    . traMADol (ULTRAM) 50 MG tablet Take 50 mg by mouth every 12 (twelve) hours as needed (for pain.).      No facility-administered medications prior to visit.      Allergies:   Ivp dye [iodinated diagnostic agents]; Amoxicillin-pot clavulanate; Lincocin [lincomycin hcl]; Nitrofurantoin; Penicillins; Sulfa antibiotics; Sulfamethoxazole-trimethoprim; and Sulfasalazine   Social History   Socioeconomic History  . Marital status: Widowed    Spouse name: Not on file    . Number of children: Not on file  . Years of education: Not on file  . Highest education level: Not on file  Occupational History  . Not on file  Social Needs  . Financial resource strain: Not on file  . Food insecurity:    Worry: Not on file    Inability: Not on file  . Transportation needs:    Medical: Not on file    Non-medical: Not on file  Tobacco Use  . Smoking status: Former Smoker    Packs/day: 2.00  Years: 20.00    Pack years: 40.00    Types: Cigarettes    Last attempt to quit: 04/08/2004    Years since quitting: 13.3  . Smokeless tobacco: Never Used  Substance and Sexual Activity  . Alcohol use: No  . Drug use: No  . Sexual activity: Not Currently    Birth control/protection: Surgical  Lifestyle  . Physical activity:    Days per week: Not on file    Minutes per session: Not on file  . Stress: Not on file  Relationships  . Social connections:    Talks on phone: Not on file    Gets together: Not on file    Attends religious service: Not on file    Active member of club or organization: Not on file    Attends meetings of clubs or organizations: Not on file    Relationship status: Not on file  Other Topics Concern  . Not on file  Social History Narrative  . Not on file     Family History:  The patient's family history includes Colon cancer (age of onset: 33) in her sister; Hypertension in her sister.   Review of Systems:   Please see the history of present illness.     General:  No chills, fever, night sweats or weight changes.  Cardiovascular:  No chest pain, edema, orthopnea, palpitations, paroxysmal nocturnal dyspnea. Positive for dyspnea on exertion.  Dermatological: No rash, lesions/masses Respiratory: No cough, dyspnea Urologic: No hematuria, dysuria Abdominal:   No nausea, vomiting, bright red blood per rectum, melena, or hematemesis. Positive for constipation and diarrhea.  Neurologic:  No visual changes, wkns, changes in mental status. All  other systems reviewed and are otherwise negative except as noted above.   Physical Exam:    VS:  BP (!) 116/58   Pulse 79   Ht '5\' 3"'$  (1.6 m)   Wt 239 lb (108.4 kg)   SpO2 97%   BMI 42.34 kg/m    General: Well developed, well nourished Caucasian female appearing in no acute distress. Head: Normocephalic, atraumatic, sclera non-icteric, no xanthomas, nares are without discharge.  Neck: No carotid bruits. JVD not elevated.  Lungs: Respirations regular and unlabored, without wheezes or rales.  Heart: Regular rate and rhythm. No S3 or S4.  No rubs or gallops appreciated. 2/6 SEM along RUSB.  Abdomen: Soft, non-tender, non-distended with normoactive bowel sounds. No hepatomegaly. No rebound/guarding. No obvious abdominal masses. Msk:  Strength and tone appear normal for age. No joint deformities or effusions. Extremities: No clubbing or cyanosis. No lower extremity edema.  Distal pedal pulses are 2+ bilaterally. Neuro: Alert and oriented X 3. Moves all extremities spontaneously. No focal deficits noted. Psych:  Responds to questions appropriately with a normal affect. Skin: No rashes or lesions noted  Wt Readings from Last 3 Encounters:  07/26/17 239 lb (108.4 kg)  06/23/17 233 lb (105.7 kg)  06/14/17 236 lb (107 kg)     Studies/Labs Reviewed:   EKG:  EKG is not ordered today.   Recent Labs: 06/14/2017: BUN 22; Creatinine, Ser 0.84; Hemoglobin 13.0; Platelets 240; Potassium 4.0; Sodium 142   Lipid Panel No results found for: CHOL, TRIG, HDL, CHOLHDL, VLDL, LDLCALC, LDLDIRECT  Additional studies/ records that were reviewed today include:   Echocardiogram: 07/01/2017 Study Conclusions  - Left ventricle: The cavity size was normal. Wall thickness was   increased in a pattern of mild LVH. Systolic function was normal.   The estimated ejection fraction was in  the range of 55% to 60%.   Wall motion was normal; there were no regional wall motion   abnormalities. Left ventricular  diastolic function parameters   were normal for the patient&'s age. - Aortic valve: Mildly calcified annulus. Trileaflet. There was   mild regurgitation. - Mitral valve: Mildly to moderately calcified annulus. - Right atrium: Central venous pressure (est): 3 mm Hg. - Atrial septum: No defect or patent foramen ovale was identified. - Tricuspid valve: There was trivial regurgitation. - Pulmonary arteries: Systolic pressure could not be accurately   estimated. - Pericardium, extracardiac: There was no pericardial effusion.  Assessment:    1. Abnormal EKG   2. Chest pain, unspecified type   3. Preoperative cardiovascular examination   4. Essential hypertension   5. Hyperlipidemia LDL goal <70      Plan:   In order of problems listed above:  1. Abnormal EKG/ Atypical Chest Pain - Was previously referred to Cardiology due to her EKG showing a LAFB and anterior septal Q-waves with no prior tracings available for comparison. An echocardiogram was obtained for initial evaluation and showed a preserved EF of 55 to 60%, no regional wall motion abnormalities, mild AI, and trivial TR. She denies any recent episodes of exertional chest discomfort and reports one episode approximately 2 weeks ago which resolved when she had a bowel movement as outlined above.  - Reviewed with the patient and her family today. Will not plan for further ischemic evaluation at this time. If she does develop recurrent symptoms of chest discomfort, could pursue a NST for further evaluation.   2. Preoperative Cardiac Clearance - With her denying any recent anginal symptoms and her echocardiogram showing a preserved EF and no regional WMA, would not pursue further ischemic evaluation at this time. RCRI Risk is low at 0.9% risk of a major cardiac event. She is of acceptable risk to proceed with planned EGD and Colonoscopy. Will forward to Dr. Oneida Alar to make her aware. She can hold ASA 5 days prior to her procedure if  needed.  3. HTN - BP is well controlled at 116/58 during today's visit.  Continue Lisinopril 10 mg daily.  4. HLD - Followed by PCP. Remains on Atorvastatin 80 mg daily.   Medication Adjustments/Labs and Tests Ordered: Current medicines are reviewed at length with the patient today.  Concerns regarding medicines are outlined above.  Medication changes, Labs and Tests ordered today are listed in the Patient Instructions below. Patient Instructions  Your physician wants you to follow-up in:  6 months with Dr.Branch You will receive a reminder letter in the mail two months in advance. If you don't receive a letter, please call our office to schedule the follow-up appointment.  Your physician recommends that you continue on your current medications as directed. Please refer to the Current Medication list given to you today.  If you need a refill on your cardiac medications before your next appointment, please call your pharmacy.  No lab work or tests ordered today  Thank you for choosing [facility] !         Signed, Erma Heritage, PA-C  07/26/2017 5:35 PM    [facility] [address] [place], [place] [address] Phone: [phone]

## 2017-07-26 ENCOUNTER — Ambulatory Visit (INDEPENDENT_AMBULATORY_CARE_PROVIDER_SITE_OTHER): Payer: Medicare Other | Admitting: Student

## 2017-07-26 ENCOUNTER — Encounter: Payer: Self-pay | Admitting: Student

## 2017-07-26 VITALS — BP 116/58 | HR 79 | Ht 63.0 in | Wt 239.0 lb

## 2017-07-26 DIAGNOSIS — R9431 Abnormal electrocardiogram [ECG] [EKG]: Secondary | ICD-10-CM

## 2017-07-26 DIAGNOSIS — Z01810 Encounter for preprocedural cardiovascular examination: Secondary | ICD-10-CM

## 2017-07-26 DIAGNOSIS — E785 Hyperlipidemia, unspecified: Secondary | ICD-10-CM

## 2017-07-26 DIAGNOSIS — R079 Chest pain, unspecified: Secondary | ICD-10-CM | POA: Diagnosis not present

## 2017-07-26 DIAGNOSIS — I10 Essential (primary) hypertension: Secondary | ICD-10-CM

## 2017-07-26 NOTE — Patient Instructions (Addendum)
Your physician wants you to follow-up in:6 months  with Dr.Branch You will receive a reminder letter in the mail two months in advance. If you don't receive a letter, please call our office to schedule the follow-up appointment.   Your physician recommends that you continue on your current medications as directed. Please refer to the Current Medication list given to you today.    If you need a refill on your cardiac medications before your next appointment, please call your pharmacy.      No lab work or tests ordered today.       Thank you for choosing [facility] !         

## 2017-08-01 NOTE — Telephone Encounter (Signed)
Dr. Oneida Alar, pt seen Cardiology 07/26/17. Is it ok to proceed with TCS/EGD with Propofol as previously scheduled?

## 2017-08-02 ENCOUNTER — Telehealth: Payer: Self-pay | Admitting: Gastroenterology

## 2017-08-02 NOTE — Telephone Encounter (Signed)
PATIENT CALLED AND SAID THAT HER CARDIOLOGIST RELEASED HER TO HAVE HER PROCEDURES.  PLEASE CALL

## 2017-08-02 NOTE — Telephone Encounter (Signed)
PLEASE CALL PT. WE CAN GO AHEAD & schedule HER TCS FOR 2-3 WEEKS FROM NOW BUT SHE CANNOT HAVE THE TCS UNLESS CLEARED BY CARDIOLOGY.

## 2017-08-02 NOTE — Telephone Encounter (Signed)
See prior phone note. Tammy Love has documented and sent to [facility]. Patient aware

## 2017-08-08 ENCOUNTER — Telehealth: Payer: Self-pay | Admitting: Gastroenterology

## 2017-08-08 ENCOUNTER — Other Ambulatory Visit: Payer: Self-pay

## 2017-08-08 DIAGNOSIS — R197 Diarrhea, unspecified: Secondary | ICD-10-CM

## 2017-08-08 DIAGNOSIS — Z800 Family history of malignant neoplasm of digestive organs: Secondary | ICD-10-CM

## 2017-08-08 DIAGNOSIS — R1013 Epigastric pain: Secondary | ICD-10-CM

## 2017-08-08 DIAGNOSIS — R112 Nausea with vomiting, unspecified: Secondary | ICD-10-CM

## 2017-08-08 DIAGNOSIS — K5900 Constipation, unspecified: Secondary | ICD-10-CM

## 2017-08-08 DIAGNOSIS — R634 Abnormal weight loss: Secondary | ICD-10-CM

## 2017-08-08 NOTE — Telephone Encounter (Signed)
Pt called to follow up on scheduling her tcs/egd. She was in the office 6/25 and said that her cardiologist had cleared her to have procedures done. Please advise and call her at [phone]

## 2017-08-08 NOTE — Telephone Encounter (Signed)
Called and informed pt of pre-op appt 09/21/17 at 9:00am. Letter mailed with procedure instructions.

## 2017-08-08 NOTE — Telephone Encounter (Signed)
Called pt. She has been cleared by cardiology. TCS/EGD with Propofol scheduled as originally ordered by [facility]. Procedure scheduled for 09/27/17 at 2:15pm (1st available). She already has prep. Instructions to be mailed after pre-op is scheduled.

## 2017-08-08 NOTE — Telephone Encounter (Signed)
See previous phone note (procedure scheduled).

## 2017-09-20 ENCOUNTER — Encounter (HOSPITAL_COMMUNITY): Payer: Self-pay

## 2017-09-21 ENCOUNTER — Encounter (HOSPITAL_COMMUNITY)
Admission: RE | Admit: 2017-09-21 | Discharge: 2017-09-21 | Disposition: A | Discharge status: Home / Self Care | Payer: Medicare Other | Source: Ambulatory Visit | Attending: Gastroenterology | Admitting: Gastroenterology

## 2017-09-21 ENCOUNTER — Telehealth: Payer: Self-pay

## 2017-09-21 ENCOUNTER — Telehealth: Payer: Self-pay | Admitting: Gastroenterology

## 2017-09-21 NOTE — Telephone Encounter (Signed)
Pt was returning a call. Please call her back at [phone]

## 2017-09-21 NOTE — Telephone Encounter (Signed)
Pt called office, tried to call her back. No answer, LMOVM.

## 2017-09-21 NOTE — Telephone Encounter (Signed)
See other phone note

## 2017-09-21 NOTE — Telephone Encounter (Signed)
Tried to call pt back, no answer, LMOVM to call office.

## 2017-09-21 NOTE — Telephone Encounter (Signed)
Pt called office back. Advised to arrive at hospital 09/27/17 at 12:00pm. Start drinking 2nd half of prep at 8:30am, NPO after 10:30am.

## 2017-09-21 NOTE — Telephone Encounter (Signed)
Endo scheduler advised for TCS/EGD w/Propofol 09/27/17 be moved up to 1:30pm, arrive at 12:00pm. Tried to call pt, no answer, LMOAM for return call.

## 2017-09-26 NOTE — Telephone Encounter (Signed)
Pt called office, reminded of new times for instructions.

## 2017-09-27 ENCOUNTER — Encounter (HOSPITAL_COMMUNITY): Payer: Self-pay

## 2017-09-27 ENCOUNTER — Ambulatory Visit (HOSPITAL_COMMUNITY): Payer: Medicare Other | Admitting: Anesthesiology

## 2017-09-27 ENCOUNTER — Ambulatory Visit (HOSPITAL_COMMUNITY)
Admission: RE | Admit: 2017-09-27 | Discharge: 2017-09-27 | Disposition: A | Discharge status: Home / Self Care | Payer: Medicare Other | Source: Ambulatory Visit | Attending: Gastroenterology | Admitting: Gastroenterology

## 2017-09-27 ENCOUNTER — Other Ambulatory Visit: Payer: Self-pay

## 2017-09-27 ENCOUNTER — Encounter (HOSPITAL_COMMUNITY)
Admission: RE | Disposition: A | Discharge status: Home / Self Care | Payer: Self-pay | Source: Ambulatory Visit | Attending: Gastroenterology

## 2017-09-27 DIAGNOSIS — Z87891 Personal history of nicotine dependence: Secondary | ICD-10-CM | POA: Insufficient documentation

## 2017-09-27 DIAGNOSIS — K5730 Diverticulosis of large intestine without perforation or abscess without bleeding: Secondary | ICD-10-CM | POA: Insufficient documentation

## 2017-09-27 DIAGNOSIS — K648 Other hemorrhoids: Secondary | ICD-10-CM | POA: Insufficient documentation

## 2017-09-27 DIAGNOSIS — K263 Acute duodenal ulcer without hemorrhage or perforation: Secondary | ICD-10-CM | POA: Diagnosis not present

## 2017-09-27 DIAGNOSIS — Z7951 Long term (current) use of inhaled steroids: Secondary | ICD-10-CM | POA: Diagnosis not present

## 2017-09-27 DIAGNOSIS — F319 Bipolar disorder, unspecified: Secondary | ICD-10-CM | POA: Diagnosis not present

## 2017-09-27 DIAGNOSIS — K2980 Duodenitis without bleeding: Secondary | ICD-10-CM | POA: Insufficient documentation

## 2017-09-27 DIAGNOSIS — K2961 Other gastritis with bleeding: Secondary | ICD-10-CM | POA: Diagnosis not present

## 2017-09-27 DIAGNOSIS — R634 Abnormal weight loss: Secondary | ICD-10-CM

## 2017-09-27 DIAGNOSIS — Z791 Long term (current) use of non-steroidal anti-inflammatories (NSAID): Secondary | ICD-10-CM | POA: Diagnosis not present

## 2017-09-27 DIAGNOSIS — E119 Type 2 diabetes mellitus without complications: Secondary | ICD-10-CM | POA: Insufficient documentation

## 2017-09-27 DIAGNOSIS — R197 Diarrhea, unspecified: Secondary | ICD-10-CM | POA: Insufficient documentation

## 2017-09-27 DIAGNOSIS — Q438 Other specified congenital malformations of intestine: Secondary | ICD-10-CM | POA: Insufficient documentation

## 2017-09-27 DIAGNOSIS — I10 Essential (primary) hypertension: Secondary | ICD-10-CM | POA: Diagnosis not present

## 2017-09-27 DIAGNOSIS — E7800 Pure hypercholesterolemia, unspecified: Secondary | ICD-10-CM | POA: Insufficient documentation

## 2017-09-27 DIAGNOSIS — R1033 Periumbilical pain: Secondary | ICD-10-CM | POA: Diagnosis present

## 2017-09-27 DIAGNOSIS — J45909 Unspecified asthma, uncomplicated: Secondary | ICD-10-CM | POA: Insufficient documentation

## 2017-09-27 DIAGNOSIS — D124 Benign neoplasm of descending colon: Secondary | ICD-10-CM | POA: Diagnosis not present

## 2017-09-27 DIAGNOSIS — K319 Disease of stomach and duodenum, unspecified: Secondary | ICD-10-CM | POA: Diagnosis not present

## 2017-09-27 DIAGNOSIS — D123 Benign neoplasm of transverse colon: Secondary | ICD-10-CM | POA: Diagnosis not present

## 2017-09-27 DIAGNOSIS — D122 Benign neoplasm of ascending colon: Secondary | ICD-10-CM | POA: Diagnosis not present

## 2017-09-27 DIAGNOSIS — K5900 Constipation, unspecified: Secondary | ICD-10-CM

## 2017-09-27 DIAGNOSIS — R1013 Epigastric pain: Secondary | ICD-10-CM | POA: Diagnosis not present

## 2017-09-27 DIAGNOSIS — K210 Gastro-esophageal reflux disease with esophagitis: Secondary | ICD-10-CM | POA: Diagnosis not present

## 2017-09-27 DIAGNOSIS — Z800 Family history of malignant neoplasm of digestive organs: Secondary | ICD-10-CM | POA: Insufficient documentation

## 2017-09-27 DIAGNOSIS — Z79899 Other long term (current) drug therapy: Secondary | ICD-10-CM | POA: Insufficient documentation

## 2017-09-27 DIAGNOSIS — R112 Nausea with vomiting, unspecified: Secondary | ICD-10-CM

## 2017-09-27 DIAGNOSIS — Z7982 Long term (current) use of aspirin: Secondary | ICD-10-CM | POA: Insufficient documentation

## 2017-09-27 DIAGNOSIS — Z96652 Presence of left artificial knee joint: Secondary | ICD-10-CM | POA: Insufficient documentation

## 2017-09-27 DIAGNOSIS — Z7984 Long term (current) use of oral hypoglycemic drugs: Secondary | ICD-10-CM | POA: Diagnosis not present

## 2017-09-27 DIAGNOSIS — Z85828 Personal history of other malignant neoplasm of skin: Secondary | ICD-10-CM | POA: Insufficient documentation

## 2017-09-27 HISTORY — PX: POLYPECTOMY: SHX5525

## 2017-09-27 HISTORY — PX: COLONOSCOPY WITH PROPOFOL: SHX5780

## 2017-09-27 HISTORY — PX: ESOPHAGOGASTRODUODENOSCOPY (EGD) WITH PROPOFOL: SHX5813

## 2017-09-27 HISTORY — PX: BIOPSY: SHX5522

## 2017-09-27 LAB — GLUCOSE, CAPILLARY
GLUCOSE-CAPILLARY: 162 mg/dL — AB (ref 70–99)
Glucose-Capillary: 89 mg/dL (ref 70–99)

## 2017-09-27 SURGERY — COLONOSCOPY WITH PROPOFOL
Anesthesia: Monitor Anesthesia Care

## 2017-09-27 MED ORDER — LACTATED RINGERS IV SOLN
INTRAVENOUS | Status: DC
  Administered 2017-09-27 (×2): via INTRAVENOUS

## 2017-09-27 MED ORDER — LACTATED RINGERS IV SOLN: INTRAVENOUS | Status: DC

## 2017-09-27 MED ORDER — CHLORHEXIDINE GLUCONATE CLOTH 2 % EX PADS: 6.0000 | MEDICATED_PAD | Freq: Once | CUTANEOUS | Status: DC

## 2017-09-27 MED ORDER — PHENYLEPHRINE 40 MCG/ML (10ML) SYRINGE FOR IV PUSH (FOR BLOOD PRESSURE SUPPORT)
PREFILLED_SYRINGE | INTRAVENOUS | Status: AC
  Filled 2017-09-27: qty 10

## 2017-09-27 MED ORDER — GLYCOPYRROLATE 0.2 MG/ML IJ SOLN
INTRAMUSCULAR | Status: AC
  Filled 2017-09-27: qty 1

## 2017-09-27 MED ORDER — PROMETHAZINE HCL 25 MG/ML IJ SOLN: 6.2500 mg | INTRAMUSCULAR | Status: DC | PRN

## 2017-09-27 MED ORDER — MEPERIDINE HCL 100 MG/ML IJ SOLN: 6.2500 mg | INTRAMUSCULAR | Status: DC | PRN

## 2017-09-27 MED ORDER — PHENYLEPHRINE HCL 10 MG/ML IJ SOLN
INTRAMUSCULAR | Status: DC | PRN
  Administered 2017-09-27 (×3): 80 ug via INTRAVENOUS

## 2017-09-27 MED ORDER — PROPOFOL 10 MG/ML IV BOLUS
INTRAVENOUS | Status: AC
  Filled 2017-09-27: qty 20

## 2017-09-27 MED ORDER — STERILE WATER FOR IRRIGATION IR SOLN
Status: DC | PRN
  Administered 2017-09-27: 1.5 mL

## 2017-09-27 MED ORDER — LIDOCAINE HCL (PF) 1 % IJ SOLN
INTRAMUSCULAR | Status: AC
  Filled 2017-09-27: qty 5

## 2017-09-27 MED ORDER — HYDROCODONE-ACETAMINOPHEN 7.5-325 MG PO TABS: 1.0000 | ORAL_TABLET | Freq: Once | ORAL | Status: DC | PRN

## 2017-09-27 MED ORDER — PROPOFOL 10 MG/ML IV BOLUS
INTRAVENOUS | Status: AC
  Filled 2017-09-27: qty 60

## 2017-09-27 MED ORDER — PROPOFOL 10 MG/ML IV BOLUS
INTRAVENOUS | Status: DC | PRN
  Administered 2017-09-27 (×2): 25 mg via INTRAVENOUS

## 2017-09-27 MED ORDER — PROPOFOL 500 MG/50ML IV EMUL
INTRAVENOUS | Status: DC | PRN
  Administered 2017-09-27: 150 ug/kg/min via INTRAVENOUS
  Administered 2017-09-27 (×3): via INTRAVENOUS

## 2017-09-27 MED ORDER — HYDROMORPHONE HCL 1 MG/ML IJ SOLN: 0.2500 mg | INTRAMUSCULAR | Status: DC | PRN

## 2017-09-27 NOTE — Transfer of Care (Signed)
Immediate Anesthesia Transfer of Care Note  Patient: Tammy Love  Procedure(s) Performed: COLONOSCOPY WITH PROPOFOL (N/A ) ESOPHAGOGASTRODUODENOSCOPY (EGD) WITH PROPOFOL (N/A ) POLYPECTOMY BIOPSY  Patient Location: PACU  Anesthesia Type:MAC  Level of Consciousness: awake, alert , oriented and patient cooperative  Airway & Oxygen Therapy: Patient Spontanous Breathing  Post-op Assessment: Report given to RN and Post -op Vital signs reviewed and stable  Post vital signs: Reviewed and stable  Last Vitals:  Vitals Value Taken Time  BP 104/61 09/27/2017  2:49 PM  Temp    Pulse 77 09/27/2017  2:54 PM  Resp 21 09/27/2017  2:54 PM  SpO2 86 % 09/27/2017  2:54 PM  Vitals shown include unvalidated device data.  Last Pain:  Vitals:   09/27/17 1338  TempSrc:   PainSc: 0-No pain         Complications: No apparent anesthesia complications

## 2017-09-27 NOTE — Anesthesia Preprocedure Evaluation (Signed)
Anesthesia Evaluation  Patient identified by MRN, date of birth, ID band Patient awake    Reviewed: Allergy & Precautions, H&P , NPO status , Patient's Chart, lab work & pertinent test results, reviewed documented beta blocker date and time   Airway Mallampati: II  TM Distance: >3 FB Neck ROM: full    Dental no notable dental hx. (+) Dental Advidsory Given, Teeth Intact   Pulmonary neg pulmonary ROS, asthma , former smoker,    Pulmonary exam normal breath sounds clear to auscultation       Cardiovascular Exercise Tolerance: Good hypertension, negative cardio ROS  + Valvular Problems/Murmurs  Rhythm:regular Rate:Normal     Neuro/Psych  Headaches, negative neurological ROS  negative psych ROS   GI/Hepatic negative GI ROS, Neg liver ROS, hiatal hernia, GERD  ,  Endo/Other  negative endocrine ROSdiabetes, Type 2  Renal/GU negative Renal ROS  negative genitourinary   Musculoskeletal   Abdominal   Peds  Hematology negative hematology ROS (+)   Anesthesia Other Findings FBS 162  Reproductive/Obstetrics negative OB ROS                             Anesthesia Physical Anesthesia Plan  ASA: III  Anesthesia Plan: MAC   Post-op Pain Management:    Induction:   PONV Risk Score and Plan:   Airway Management Planned:   Additional Equipment:   Intra-op Plan:   Post-operative Plan:   Informed Consent: I have reviewed the patients History and Physical, chart, labs and discussed the procedure including the risks, benefits and alternatives for the proposed anesthesia with the patient or authorized representative who has indicated his/her understanding and acceptance.   Dental Advisory Given  Plan Discussed with: CRNA and Anesthesiologist  Anesthesia Plan Comments:         Anesthesia Quick Evaluation

## 2017-09-27 NOTE — Op Note (Signed)
[facility] Patient Name: Tammy Love Procedure Date: 09/27/2017 10:31 AM MRN: [id number] Date of Birth: 1945/04/19 Attending MD: Barney Drain MD, MD CSN: [id number] Age: 72 Admit Type: Outpatient Procedure:                Colonoscopy WITH COLD FORCEPS BIOPSY & COLD                            SNARE/SNARE CAUTERY POLYPECTOMY Indications:              Periumbilical abdominal pain, INTERMITTENT                            Diarrhea, Family history of colon cancer Providers:                Barney Drain MD, MD, Selena Lesser RN, RN, Aram Candela Referring MD:             Keane Police, MD Medicines:                Propofol per Anesthesia Complications:            No immediate complications. Estimated Blood Loss:     Estimated blood loss was minimal. Procedure:                Pre-Anesthesia Assessment:                           - Prior to the procedure, a History and Physical                            was performed, and patient medications and                            allergies were reviewed. The patient's tolerance of                            previous anesthesia was also reviewed. The risks                            and benefits of the procedure and the sedation                            options and risks were discussed with the patient.                            All questions were answered, and informed consent                            was obtained. Prior Anticoagulants: The patient has                            taken aspirin, last dose was day of procedure. ASA  Grade Assessment: II - A patient with mild systemic                            disease. After reviewing the risks and benefits,                            the patient was deemed in satisfactory condition to                            undergo the procedure. After obtaining informed                            consent, the colonoscope was passed under direct                             vision. Throughout the procedure, the patient's                            blood pressure, pulse, and oxygen saturations were                            monitored continuously. The CF-HQ190L ([id number])                            scope was introduced through the anus and advanced                            to the 10 cm into the ileum. The colonoscopy was                            somewhat difficult due to a tortuous colon.                            Successful completion of the procedure was aided by                            straightening and shortening the scope to obtain                            bowel loop reduction and COLOWRAP. The patient                            tolerated the procedure well. The quality of the                            bowel preparation was good. The terminal ileum,                            ileocecal valve, appendiceal orifice, and rectum                            were photographed. Scope In: 1:43:21 PM Scope Out: 2:21:25 PM Scope Withdrawal Time: 0 hours 33 minutes  32 seconds  Total Procedure Duration: 0 hours 38 minutes 4 seconds  Findings:      The terminal ileum appeared normal.      Five sessile polyps were found in the transverse colon(2), hepatic       flexure(2) and ascending colon(1). The polyps were 4 to 6 mm in size.       These polyps were removed with a cold snare. Resection and retrieval       were complete.      Two sessile polyps were found in the descending colon and proximal       ascending colon. The polyps were 5 to 7 mm in size. These polyps were       removed with a hot snare. Resection and retrieval were complete.      Multiple small and large-mouthed diverticula were found in the       recto-sigmoid colon, sigmoid colon, descending colon and transverse       colon.      Internal hemorrhoids were found. The hemorrhoids were small.      The recto-sigmoid colon, sigmoid colon and descending colon were        moderately redundant.      Biopsies were taken with a cold forceps for histology. Impression:               - The examined portion of the ileum was normal.                           - Five 4 to 6 mm polyps in the descending colon, in                            the transverse colon and in the ascending colon,                            removed with a cold snare.                           - Two 5 to 7 mm polyps in the descending colon and                            in the proximal ascending colon, removed with a hot                            snare. Resected and retrieved.                           - Diverticulosis in the recto-sigmoid colon, in the                            sigmoid colon, in the descending colon and in the                            transverse colon.                           - Internal hemorrhoids.                           -  Redundant LEFT colon. Moderate Sedation:      Per Anesthesia Care Recommendation:           - Patient has a contact number available for                            emergencies. The signs and symptoms of potential                            delayed complications were discussed with the                            patient. Return to normal activities tomorrow.                            Written discharge instructions were provided to the                            patient.                           - High fiber diet and low fat diet.                           - Continue present medications.                           - Await pathology results.                           - Repeat colonoscopy in 3 years for surveillance.                           - Return to GI office in 4 months. Procedure Code(s):        --- Professional ---                           [phone], Colonoscopy, flexible; with removal of                            tumor(s), polyp(s), or other lesion(s) by snare                            technique Diagnosis Code(s):        --- Professional  ---                           K64.8, Other hemorrhoids                           D12.3, Benign neoplasm of transverse colon (hepatic                            flexure or splenic flexure)                           D12.4, Benign neoplasm of descending colon  D12.2, Benign neoplasm of ascending colon                           K09.38, Periumbilical pain                           R19.7, Diarrhea, unspecified                           Z80.0, Family history of malignant neoplasm of                            digestive organs                           K57.30, Diverticulosis of large intestine without                            perforation or abscess without bleeding                           Q43.8, Other specified congenital malformations of                            intestine CPT copyright 2017 [employer]. All rights reserved. The codes documented in this report are preliminary and upon coder review may  be revised to meet current compliance requirements. Barney Drain, MD Barney Drain MD, MD 09/27/2017 3:09:19 PM This report has been signed electronically. Number of Addenda: 0

## 2017-09-27 NOTE — Discharge Instructions (Signed)
You have MILD ESOPHAGITIS DUE TO ACID REFLUX AND SEVERE gastritis/DUODENITIS MOST LIKELY DUE TO ASPIRIN AND CELEBREX BEING USED TOGETHER AND THIS IS THE CAUSE FOR YOUR ABDOMINAL PAIN AND NAUSEA/VOMITING. THE LAST PART OF YOUR SMALL BOWEL IS NORMAL. You had 7 polyps removed. You HAVE DIVERTICULOSIS IN YOUR RIGHT AND LEFT COLON AND SMALL  internal hemorrhoids. I biopsied your stomach, SMALL BOWEL, AND COLON.   FOLLOW A HIGH FIBER/LOW FAT DIET. AVOID ITEMS THAT CAUSE BLOATING. SEE INFO BELOW.  AVOID ITEMS THAT TRIGGER GASTRITIS. SEE INFO BELOW.  Since you have no history of strokes or A heart attack, I would stop the ASPIRIN. YOU CAN CONTINUE CELEBREX. When you use aspirin together with Celebrex it negates the GI protective effects of CELEBREX.  TO TREAT ESOPHAGITIS, GASTRITIS. AND DUODENITIS, use OMEPRAZOLE 30 minutes prior to your first meal.  USE AMITIZA TWICE DAILY. IT TREATS CONSTIPATION BUT MAY CAUSE NAUSEA.   YOUR BIOPSY RESULTS WILL BE BACK IN 7 DAYS.  FOLLOW UP IN 4 MOS. Tuesday December 17 at 10am  Next colonoscopy in 3 years.   ENDOSCOPY Care After Read the instructions outlined below and refer to this sheet in the next week. These discharge instructions provide you with general information on caring for yourself after you leave the hospital. While your treatment has been planned according to the most current medical practices available, unavoidable complications occasionally occur. If you have any problems or questions after discharge, call DR. FIELDS, [phone].  ACTIVITY  You may resume your regular activity, but move at a slower pace for the next 24 hours.   Take frequent rest periods for the next 24 hours.   Walking will help get rid of the air and reduce the bloated feeling in your belly (abdomen).   No driving for 24 hours (because of the medicine (anesthesia) used during the test).   You may shower.   Do not sign any important legal documents or operate any  machinery for 24 hours (because of the anesthesia used during the test).    NUTRITION  Drink plenty of fluids.   You may resume your normal diet as instructed by your doctor.   Begin with a light meal and progress to your normal diet. Heavy or fried foods are harder to digest and may make you feel sick to your stomach (nauseated).   Avoid alcoholic beverages for 24 hours or as instructed.    MEDICATIONS  You may resume your normal medications.   WHAT YOU CAN EXPECT TODAY  Some feelings of bloating in the abdomen.   Passage of more gas than usual.   Spotting of blood in your stool or on the toilet paper  .  IF YOU HAD POLYPS REMOVED DURING THE ENDOSCOPY:  Eat a soft diet IF YOU HAVE NAUSEA, BLOATING, ABDOMINAL PAIN, OR VOMITING.    FINDING OUT THE RESULTS OF YOUR TEST Not all test results are available during your visit. DR. Oneida Alar WILL CALL YOU WITHIN 14 DAYS OF YOUR PROCEDUE WITH YOUR RESULTS. Do not assume everything is normal if you have not heard from DR. FIELDS IN TWO WEEKS, CALL HER OFFICE AT [phone].  SEEK IMMEDIATE MEDICAL ATTENTION AND CALL THE OFFICE: [phone] IF:  You have more than a spotting of blood in your stool.   Your belly is swollen (abdominal distention).   You are nauseated or vomiting.   You have a temperature over 101F.   You have abdominal pain or discomfort that is severe or gets worse throughout  the day.   Gastritis/DUODENITIS  Gastritis is an inflammation (the body's way of reacting to injury and/or infection) of the stomach AND SMALL BOWEL. It is often caused by viral or bacterial (germ) infections. It can also be caused BY ALCOHOL, ASPIRIN, BC/GOODY POWDER'S, (IBUPROFEN) MOTRIN, OR ALEVE (NAPROXEN), chemicals (including alcohol), SPICY FOODS, and medications. This illness may be associated with generalized malaise (feeling tired, not well), UPPER ABDOMINAL STOMACH cramps, and fever. One common bacterial cause of gastritis is an  organism known as H. Pylori. This can be treated with antibiotics.    High-Fiber Diet A high-fiber diet changes your normal diet to include more whole grains, legumes, fruits, and vegetables. Changes in the diet involve replacing refined carbohydrates with unrefined foods. The calorie level of the diet is essentially unchanged. The Dietary Reference Intake (recommended amount) for adult males is 38 grams per day. For adult females, it is 25 grams per day. Pregnant and lactating women should consume 28 grams of fiber per day. Fiber is the intact part of a plant that is not broken down during digestion. Functional fiber is fiber that has been isolated from the plant to provide a beneficial effect in the body. PURPOSE  Increase stool bulk.   Ease and regulate bowel movements.   Lower cholesterol.   REDUCE RISK OF COLON CANCER  INDICATIONS THAT YOU NEED MORE FIBER  Constipation and hemorrhoids.   Uncomplicated diverticulosis (intestine condition) and irritable bowel syndrome.   Weight management.   As a protective measure against hardening of the arteries (atherosclerosis), diabetes, and cancer.   GUIDELINES FOR INCREASING FIBER IN THE DIET  Start adding fiber to the diet slowly. A gradual increase of about 5 more grams (2 slices of whole-wheat bread, 2 servings of most fruits or vegetables, or 1 bowl of high-fiber cereal) per day is best. Too rapid an increase in fiber may result in constipation, flatulence, and bloating.   Drink enough water and fluids to keep your urine clear or pale yellow. Water, juice, or caffeine-free drinks are recommended. Not drinking enough fluid may cause constipation.   Eat a variety of high-fiber foods rather than one type of fiber.   Try to increase your intake of fiber through using high-fiber foods rather than fiber pills or supplements that contain small amounts of fiber.   The goal is to change the types of food eaten. Do not supplement your present  diet with high-fiber foods, but replace foods in your present diet.   INCLUDE A VARIETY OF FIBER SOURCES  Replace refined and processed grains with whole grains, canned fruits with fresh fruits, and incorporate other fiber sources. White rice, white breads, and most bakery goods contain little or no fiber.   Brown whole-grain rice, buckwheat oats, and many fruits and vegetables are all good sources of fiber. These include: broccoli, Brussels sprouts, cabbage, cauliflower, beets, sweet potatoes, white potatoes (skin on), carrots, tomatoes, eggplant, squash, berries, fresh fruits, and dried fruits.   Cereals appear to be the richest source of fiber. Cereal fiber is found in whole grains and bran. Bran is the fiber-rich outer coat of cereal grain, which is largely removed in refining. In whole-grain cereals, the bran remains. In breakfast cereals, the largest amount of fiber is found in those with "bran" in their names. The fiber content is sometimes indicated on the label.   You may need to include additional fruits and vegetables each day.   In baking, for 1 cup white flour, you may use the  following substitutions:   1 cup whole-wheat flour minus 2 tablespoons.   1/2 cup white flour plus 1/2 cup whole-wheat flour.    Polyps, Colon  A polyp is extra tissue that grows inside your body. Colon polyps grow in the large intestine. The large intestine, also called the colon, is part of your digestive system. It is a long, hollow tube at the end of your digestive tract where your body makes and stores stool. Most polyps are not dangerous. They are benign. This means they are not cancerous. But over time, some types of polyps can turn into cancer. Polyps that are smaller than a pea are usually not harmful. But larger polyps could someday become or may already be cancerous. To be safe, doctors remove all polyps and test them.     PREVENTION There is not one sure way to prevent polyps. You might be  able to lower your risk of getting them if you:  Eat more fruits and vegetables and less fatty food.   Do not smoke.   Avoid alcohol.   Exercise every day.   Lose weight if you are overweight.   Eating more calcium and folate can also lower your risk of getting polyps. Some foods that are rich in calcium are milk, cheese, and broccoli. Some foods that are rich in folate are chickpeas, kidney beans, and spinach.

## 2017-09-27 NOTE — Anesthesia Postprocedure Evaluation (Signed)
Anesthesia Post Note  Patient: Tammy Love  Procedure(s) Performed: COLONOSCOPY WITH PROPOFOL (N/A ) ESOPHAGOGASTRODUODENOSCOPY (EGD) WITH PROPOFOL (N/A ) POLYPECTOMY BIOPSY  Patient location during evaluation: PACU Anesthesia Type: MAC Level of consciousness: awake and alert and oriented Pain management: pain level controlled Vital Signs Assessment: post-procedure vital signs reviewed and stable Respiratory status: spontaneous breathing Cardiovascular status: stable Postop Assessment: no apparent nausea or vomiting Anesthetic complications: no     Last Vitals:  Vitals:   09/27/17 1215  BP: 112/61  Pulse: 98  Resp: 14  Temp: 36.9 C  SpO2: 94%    Last Pain:  Vitals:   09/27/17 1338  TempSrc:   PainSc: 0-No pain                 ADAMS, AMY A

## 2017-09-27 NOTE — Op Note (Signed)
[facility] Patient Name: Tammy Love Procedure Date: 09/27/2017 10:31 AM MRN: [id number] Date of Birth: [birth year] Attending MD: Barney Drain MD, MD CSN: [id number] Age: 72 Admit Type: Outpatient Procedure:                Upper GI endoscopy WITH COLD FORCEPS BIOPSY Indications:              Epigastric abdominal pain, Periumbilical abdominal                            pain, Diarrhea Providers:                Barney Drain MD, MD, Charlsie Quest. Theda Sers RN, RN,                            Aram Candela Referring MD:             Keane Police, MD Medicines:                Propofol per Anesthesia Complications:            No immediate complications. Estimated Blood Loss:     Estimated blood loss was minimal. Procedure:                Pre-Anesthesia Assessment:                           - Prior to the procedure, a History and Physical                            was performed, and patient medications and                            allergies were reviewed. The patient's tolerance of                            previous anesthesia was also reviewed. The risks                            and benefits of the procedure and the sedation                            options and risks were discussed with the patient.                            All questions were answered, and informed consent                            was obtained. Prior Anticoagulants: The patient has                            taken aspirin, last dose was day of procedure. ASA                            Grade Assessment: II - A patient with mild systemic  disease. After reviewing the risks and benefits,                            the patient was deemed in satisfactory condition to                            undergo the procedure. After obtaining informed                            consent, the endoscope was passed under direct                            vision. Throughout the procedure, the patient's                       blood pressure, pulse, and oxygen saturations were                            monitored continuously. The GIF-H190 ([id number]) was                            introduced through the mouth, and advanced to the                            second part of duodenum. The upper GI endoscopy was                            accomplished without difficulty. The patient                            tolerated the procedure well. Scope In: 2:27:29 PM Scope Out: 2:40:26 PM Total Procedure Duration: 0 hours 12 minutes 57 seconds  Findings:      LA Grade B (one or more mucosal breaks greater than 5 mm, not extending       between the tops of two mucosal folds) esophagitis with no bleeding was       found.      Diffuse moderate inflammation with hemorrhage characterized by       congestion (edema), erosions and erythema was found in the entire       examined stomach. Biopsies were taken with a cold forceps for histology.      Diffuse severe inflammation characterized by congestion (edema),       erosions, erythema and shallow ulcerations was found in the entire       duodenum. Biopsies for histology were taken with a cold forceps for       evaluation of celiac disease. Biopsies were taken with a cold forceps       for histology. Impression:               - LA Grade B reflux esophagitis.                           - ABDOMINAL PAIN DUE TO MODERATE Erosive gastritis                            with hemorrhage  AND SEVERE EROSIVE Duodenitis IN                            SETTING OF ASA/CELEBREX. Biopsied. Moderate Sedation:      Per Anesthesia Care Recommendation:           - Patient has a contact number available for                            emergencies. The signs and symptoms of potential                            delayed complications were discussed with the                            patient. Return to normal activities tomorrow.                            Written discharge instructions  were provided to the                            patient.                           - High fiber diet and low fat diet.                           - Continue present medications. HOLD DUE TO NO                            HISTORY OF CVA OR CAD. RISK OF BLEEDING/ULCERS                            OUTWEIGHE BENEFITS AT THIS TIME. USING ASA AND                            CELEBREX TOGETHER NEGATES THE GI PROTECTIVE EFFECTS                            OF CELEBREX.                           - Await pathology results.                           - Return to my office in 4 months. Procedure Code(s):        --- Professional ---                           [phone], Esophagogastroduodenoscopy, flexible,                            transoral; with biopsy, single or multiple Diagnosis Code(s):        --- Professional ---  K21.0, Gastro-esophageal reflux disease with                            esophagitis                           K29.61, Other gastritis with bleeding                           K29.80, Duodenitis without bleeding                           R10.13, Epigastric pain                           H06.23, Periumbilical pain                           R19.7, Diarrhea, unspecified CPT copyright 2017 American Medical Association. All rights reserved. The codes documented in this report are preliminary and upon coder review may  be revised to meet current compliance requirements. Barney Drain, MD Barney Drain MD, MD 09/27/2017 3:14:45 PM This report has been signed electronically. Number of Addenda: 0

## 2017-09-27 NOTE — Anesthesia Procedure Notes (Signed)
Procedure Name: MAC Date/Time: 09/27/2017 1:32 PM Performed by: Andree Elk Robina Hamor A, CRNA Pre-anesthesia Checklist: Patient identified, Emergency Drugs available, Suction available, Patient being monitored and Timeout performed Oxygen Delivery Method: Simple face mask

## 2017-09-27 NOTE — H&P (Signed)
Primary Care Physician:  Keane Police, MD Primary Gastroenterologist:  Dr. Oneida Alar  Pre-Procedure History & Physical: HPI:  Tammy Love is a 72 y.o. female here for Change in bowel habits/dyspepsia.  Past Medical History:  Diagnosis Date  . Arthritis   . Asthma   . Bipolar disorder ([place])   . Cancer ([place])    skin cancer on hands  . Diabetes ([place])   . GERD (gastroesophageal reflux disease)   . Glaucoma   . Headache   . Heart murmur   . Hiatal hernia   . Hypercholesterolemia   . Hypertension   . Sinus disease     Past Surgical History:  Procedure Laterality Date  . ABDOMINAL HYSTERECTOMY    . colonoscopy  2011   Dr. Earley Brooke: mucosa up to terminal ileum appeared normal, biopsies from TI and rectum. MICROSCOPIC COLITIS   . DILATION AND CURETTAGE OF UTERUS     x3  . NASAL SINUS SURGERY    . REPLACEMENT TOTAL KNEE     left  . TONSILLECTOMY    . TUBAL LIGATION      Prior to Admission medications   Medication Sig Start Date End Date Taking? Authorizing Provider  acetaminophen (TYLENOL) 500 MG tablet Take 1,000 mg by mouth every 6 (six) hours as needed for mild pain or headache.   Yes [provider]  albuterol (PROVENTIL HFA;VENTOLIN HFA) 108 (90 Base) MCG/ACT inhaler Inhale 2 puffs into the lungs every 6 (six) hours as needed for wheezing or shortness of breath.   Yes [provider]  aspirin EC 81 MG tablet Take 81 mg by mouth daily.   Yes [provider]  atorvastatin (LIPITOR) 80 MG tablet Take 80 mg by mouth at bedtime.    Yes [provider]  Calcium-Magnesium-Zinc (CAL-MAG-ZINC PO) Take 1 tablet by mouth 2 (two) times daily.   Yes [provider]  celecoxib (CELEBREX) 200 MG capsule Take 200 mg by mouth daily with breakfast.    Yes [provider]  Cholecalciferol (VITAMIN D3) 2000 units TABS Take 2,000 Units by mouth daily.   Yes [provider]  fluticasone (FLONASE) 50 MCG/ACT nasal spray Place 2  sprays into both nostrils daily as needed for allergies or rhinitis.    Yes [provider]  Glucosamine-Chondroitin (GLUCOSAMINE CHONDR COMPLEX PO) Take 2 tablets by mouth daily.    Yes [provider]  ketoconazole (NIZORAL) 2 % cream Apply 1 application topically daily as needed for irritation (foot athlete's foot/itching).    Yes [provider]  lisinopril (PRINIVIL,ZESTRIL) 10 MG tablet Take 10 mg by mouth daily.   Yes [provider]  lubiprostone (AMITIZA) 8 MCG capsule Take 1 capsule (8 mcg total) by mouth 2 (two) times daily with a meal. Patient taking differently: Take 8 mcg by mouth 2 (two) times daily as needed (for constipation.).  04/28/17  Yes Mahala Menghini, PA-C  Multiple Vitamin (MULTIVITAMIN WITH MINERALS) TABS tablet Take 1 tablet by mouth daily. Centrum Silver   Yes [provider]  Omega-3 Fatty Acids (FISH OIL) 1200 MG CAPS Take 1,200 mg by mouth daily. With Omega-3 360 mg   Yes [provider]  omeprazole (PRILOSEC) 20 MG capsule Take 20 mg by mouth daily.   Yes [provider]  ondansetron (ZOFRAN-ODT) 8 MG disintegrating tablet Take 8 mg by mouth every 8 (eight) hours as needed for nausea or vomiting.   Yes [provider]  potassium chloride (MICRO-K) 10 MEQ CR  capsule Take 10 mEq by mouth daily.    Yes [provider]  QUEtiapine (SEROQUEL) 25 MG tablet Take 25 mg by mouth at bedtime.   Yes [provider]  sertraline (ZOLOFT) 50 MG tablet Take 50 mg by mouth at bedtime.    Yes [provider]  sitaGLIPtin (JANUVIA) 100 MG tablet Take 100 mg by mouth daily.   Yes [provider]  sucralfate (CARAFATE) 1 GM/10ML suspension Take 1 g by mouth 4 (four) times daily as needed (for indigestion.).    Yes [provider]  SYMBICORT 80-4.5 MCG/ACT inhaler Inhale 1 puff into the lungs daily.  04/06/17  Yes [provider]  topiramate (TOPAMAX) 100 MG tablet  Take 100-300 mg by mouth See admin instructions. Take 1 tablet (100 mg) by mouth in the morning, and 3 tablet (300 mg) by mouth at bedtime 04/07/17  Yes [provider]  Na Sulfate-K Sulfate-Mg Sulf 17.5-3.13-1.6 GM/177ML SOLN Take 1 kit by mouth as directed. Patient not taking: Reported on 09/14/2017 04/28/17   Danie Binder, MD    Allergies as of 08/08/2017 - Review Complete 07/26/2017  Allergen Reaction Noted  . Ivp dye [iodinated diagnostic agents] Anaphylaxis, Itching, Swelling, and Rash 03/31/2016  . Amoxicillin-pot clavulanate Itching and Rash 12/15/2009  . Lincocin [lincomycin hcl] Itching and Rash 04/06/2016  . Nitrofurantoin Itching and Rash 12/15/2009  . Penicillins Itching and Rash 12/15/2009  . Sulfa antibiotics Itching and Rash 12/15/2009  . Sulfamethoxazole-trimethoprim Itching and Rash 12/15/2009  . Sulfasalazine Itching and Rash 12/15/2009    Family History  Problem Relation Age of Onset  . Colon cancer Sister [place]   . Hypertension Sister     Social History   Socioeconomic History  . Marital status: Widowed    Spouse name: Not on file  . Number of children: Not on file  . Years of education: Not on file  . Highest education level: Not on file  Occupational History  . Not on file  Social Needs  . Financial resource strain: Not on file  . Food insecurity:    Worry: Not on file    Inability: Not on file  . Transportation needs:    Medical: Not on file    Non-medical: Not on file  Tobacco Use  . Smoking status: Former Smoker    Packs/day: 2.00    Years: 20.00    Pack years: 40.00    Types: Cigarettes    Last attempt to quit: 04/08/2004    Years since quitting: 13.4  . Smokeless tobacco: Never Used  Substance and Sexual Activity  . Alcohol use: No  . Drug use: No  . Sexual activity: Not Currently    Birth control/protection: Surgical  Lifestyle  . Physical activity:    Days per week: Not on file    Minutes per session:  Not on file  . Stress: Not on file  Relationships  . Social connections:    Talks on phone: Not on file    Gets together: Not on file    Attends religious service: Not on file    Active member of club or organization: Not on file    Attends meetings of clubs or organizations: Not on file    Relationship status: Not on file  . Intimate partner violence:    Fear of current or ex partner: Not on file    Emotionally abused: Not on file    Physically abused:  Not on file    Forced sexual activity: Not on file  Other Topics Concern  . Not on file  Social History Narrative  . Not on file    Review of Systems: See HPI, otherwise negative ROS   Physical Exam: BP 112/61   Pulse 98   Temp 98.4 F (36.9 C) (Oral)   Resp 14   SpO2 94%  General:   Alert,  pleasant and cooperative in NAD Head:  Normocephalic and atraumatic. Neck:  Supple; Lungs:  Clear throughout to auscultation.    Heart:  Regular rate and rhythm. Abdomen:  Soft, nontender and nondistended. Normal bowel sounds, without guarding, and without rebound.   Neurologic:  Alert and  oriented x4;  grossly normal neurologically.  Impression/Plan:     Change in bowel habits/dyspepsia  PLAN:  1. EGD/TCS TODAY.DISCUSSED PROCEDURE, BENEFITS, & RISKS: < 1% chance of medication reaction, bleeding, perforation, or rupture of spleen/liver.

## 2017-09-30 ENCOUNTER — Telehealth: Payer: Self-pay | Admitting: Gastroenterology

## 2017-09-30 ENCOUNTER — Encounter (HOSPITAL_COMMUNITY): Payer: Self-pay | Admitting: Gastroenterology

## 2017-09-30 NOTE — Telephone Encounter (Signed)
[phone]  PLEASE CALL PATIENT BACK, SHE SAID SHE WAS NOT TOLD ABOUT THE BIOPSY ON THE POLYPS THAT WERE REMOVED

## 2017-09-30 NOTE — Telephone Encounter (Signed)
PLEASE CALL PT. SHE HAD SEVEN SIMPLE ADENOMAS REMOVED. HER stomach Bx shows MILD gastritis AND SEVERE DUODENITIS DUE TO ASPIRIN AND CELEBREX.     FOLLOW A HIGH FIBER/LOW FAT DIET. AVOID ITEMS THAT CAUSE BLOATING.   Since you have no history of strokes or a heart attack,Stop the ASPIRIN.   CONTINUE CELEBREX. When you use aspirin together with Celebrex it negates the GI protective effects of CELEBREX.  TO TREAT ESOPHAGITIS, GASTRITIS. AND DUODENITIS, use OMEPRAZOLE 30 minutes prior to your first meal.  USE AMITIZA TWICE DAILY. IT TREATS CONSTIPATION BUT MAY CAUSE NAUSEA.  FOLLOW UP IN Tuesday December 17 at 10am  Next colonoscopy in 3 years.

## 2017-09-30 NOTE — Telephone Encounter (Signed)
Noted. Pt notified and will discuss further with her PCP.

## 2017-09-30 NOTE — Telephone Encounter (Signed)
PLEASE CALL PT. She should continue ASA & MAKE SURE SHE TAKES HER OMEPRAZOLE EVERY DAY. SHE MAY WANT TO DISCUSS WITH HER PCP THE BENEFITS V. RISKS OF CONTINUING CELEBREX.

## 2017-09-30 NOTE — Telephone Encounter (Signed)
Spoke with pt, she was notified of results and treatment plan. Pt states that she has had a stroke within the last year. Should pt still d/c Asprin?

## 2017-09-30 NOTE — Telephone Encounter (Signed)
Reminder in epic °

## 2018-01-24 ENCOUNTER — Ambulatory Visit: Payer: Medicare Other | Admitting: Gastroenterology

## 2018-04-11 ENCOUNTER — Encounter: Payer: Self-pay | Admitting: Gastroenterology

## 2018-04-11 ENCOUNTER — Ambulatory Visit (INDEPENDENT_AMBULATORY_CARE_PROVIDER_SITE_OTHER): Payer: Medicare Other | Admitting: Gastroenterology

## 2018-04-11 VITALS — BP 106/59 | HR 87 | Temp 96.3°F | Ht 63.0 in | Wt 230.4 lb

## 2018-04-11 DIAGNOSIS — K5900 Constipation, unspecified: Secondary | ICD-10-CM

## 2018-04-11 DIAGNOSIS — R634 Abnormal weight loss: Secondary | ICD-10-CM | POA: Diagnosis not present

## 2018-04-11 NOTE — Assessment & Plan Note (Signed)
Difficult to adequately manage.  She swings from constipation to diarrhea with low-dose Amitiza.  Difficult to find the appropriate dosing for her.  Right now she is taking it essentially every other day, twice on those days.  We will have her drop back to 8 mcg every other day.  She will keep a journal documenting stool habits and what day she takes Amitiza.  Call the progress report in a few weeks.  See her back in 4 months.  Next colonoscopy planned for 09/2020.

## 2018-04-11 NOTE — Progress Notes (Signed)
CC'D TO PCP °

## 2018-04-11 NOTE — Assessment & Plan Note (Signed)
Over the course of the past 2 years she has had a 20 pound weight loss, 9 of those pounds in the past 9 months.  Patient reports lack of appetite.  Some days she does not eat till 5 PM.  Encouraged small healthy snacks/meals every 3-4 hours throughout the day.  We have requested copy of recent labs from PCP for review.  We will have her come back in 4 months to see Dr. Oneida Alar.

## 2018-04-11 NOTE — Progress Notes (Signed)
Primary Care Physician: Keane Police, MD  Primary Gastroenterologist:  Barney Drain, MD   Chief Complaint  Patient presents with  . change in bowels    "had a big blowout in the bed the other night"    HPI: Tammy Love is a 73 y.o. female here for follow-up.  Back in August she had a EGD and colonoscopy that showed mild gastritis, severe duodenitis likely due to aspirin and Celebrex, 7 simple adenomas removed from her colon.  She had diverticulosis, hemorrhoids.  Random colon biopsy.  She was advised to stop aspirin, continue Celebrex as benefits outweigh the risk.  Advised to take omeprazole daily.  Next colonoscopy in 3 years.  Denies any vomiting or heartburn.  No dysphagia.  Some mild abdominal discomfort prior to having "blowouts" on Amitiza.  No rectal bleeding or melena. She states she takes Amitiza about four times a week. When she takes twice in one day she has a blowout and can't leave the house. She takes twice a day most times she takes the Amitiza. We discussed possibly changing her to another medication but she is reluctant because she does not want to go back to "digging out my stool".  Patient states her appetite comes and goes.  Notes her weight is down about 9 pounds in the past 9 months.  She does not have scales at home to weigh.  Sunday she will eat until 5:00 PM.   February 2018: 253 pounds March 2019:241 pounds May 2019:233 pounds June 6629:476 pounds March 2020: 230 pounds     Current Outpatient Medications  Medication Sig Dispense Refill  . acetaminophen (TYLENOL) 500 MG tablet Take 1,000 mg by mouth every 6 (six) hours as needed for mild pain or headache.    . albuterol (PROVENTIL HFA;VENTOLIN HFA) 108 (90 Base) MCG/ACT inhaler Inhale 2 puffs into the lungs every 6 (six) hours as needed for wheezing or shortness of breath.    Marland Kitchen aspirin EC 81 MG tablet Take 81 mg by mouth daily.    Marland Kitchen atorvastatin (LIPITOR) 80 MG tablet Take 80 mg by mouth at  bedtime.     . Calcium-Magnesium-Zinc (CAL-MAG-ZINC PO) Take 1 tablet by mouth 2 (two) times daily.    . Cholecalciferol (VITAMIN D3) 2000 units TABS Take 2,000 Units by mouth daily.    . diclofenac sodium (VOLTAREN) 1 % GEL Apply 2 g topically 4 (four) times daily.    . fluticasone (FLONASE) 50 MCG/ACT nasal spray Place 2 sprays into both nostrils daily as needed for allergies or rhinitis.     . Glucosamine-Chondroitin (GLUCOSAMINE CHONDR COMPLEX PO) Take 2 tablets by mouth daily.     Marland Kitchen ketoconazole (NIZORAL) 2 % cream Apply 1 application topically daily as needed for irritation (foot athlete's foot/itching).     Marland Kitchen lisinopril (PRINIVIL,ZESTRIL) 10 MG tablet Take 10 mg by mouth daily.    Marland Kitchen lubiprostone (AMITIZA) 8 MCG capsule Take 1 capsule (8 mcg total) by mouth 2 (two) times daily with a meal. (Patient taking differently: Take 8 mcg by mouth 2 (two) times daily as needed (for constipation.). ) 60 capsule 11  . Multiple Vitamin (MULTIVITAMIN WITH MINERALS) TABS tablet Take 1 tablet by mouth daily. Centrum Silver    . omeprazole (PRILOSEC) 20 MG capsule Take 20 mg by mouth daily before breakfast.    . ondansetron (ZOFRAN-ODT) 8 MG disintegrating tablet Take 8 mg by mouth every 8 (eight) hours as needed for nausea or vomiting.    Marland Kitchen  potassium chloride (MICRO-K) 10 MEQ CR capsule Take 10 mEq by mouth daily.     . QUEtiapine (SEROQUEL) 25 MG tablet Take 25 mg by mouth at bedtime.    . sertraline (ZOLOFT) 50 MG tablet Take 50 mg by mouth at bedtime.     . sitaGLIPtin (JANUVIA) 100 MG tablet Take 100 mg by mouth daily.    . sodium chloride (OCEAN) 0.65 % SOLN nasal spray Place 1 spray into both nostrils 2 (two) times daily.    . sucralfate (CARAFATE) 1 GM/10ML suspension Take 1 g by mouth 4 (four) times daily as needed (for indigestion.).     Marland Kitchen SYMBICORT 80-4.5 MCG/ACT inhaler Inhale 1 puff into the lungs daily.     Marland Kitchen topiramate (TOPAMAX) 100 MG tablet Take 100-300 mg by mouth See admin instructions.  Take 1 tablet (100 mg) by mouth in the morning, and 3 tablet (300 mg) by mouth at bedtime     No current facility-administered medications for this visit.     Allergies as of 04/11/2018 - Review Complete 04/11/2018  Allergen Reaction Noted  . Ivp dye [iodinated diagnostic agents] Anaphylaxis, Itching, Swelling, and Rash 03/31/2016  . Amoxicillin-pot clavulanate Itching and Rash 12/15/2009  . Lincocin [lincomycin hcl] Itching and Rash 04/06/2016  . Nitrofurantoin Itching and Rash 12/15/2009  . Penicillins Itching and Rash 12/15/2009  . Sulfa antibiotics Itching and Rash 12/15/2009  . Sulfamethoxazole-trimethoprim Itching and Rash 12/15/2009  . Sulfasalazine Itching and Rash 12/15/2009    ROS:  General: Negative for fever, chills, fatigue, weakness.  See HPI ENT: Negative for hoarseness, difficulty swallowing , nasal congestion. CV: Negative for chest pain, angina, palpitations, dyspnea on exertion, peripheral edema.  Respiratory: Negative for dyspnea at rest, dyspnea on exertion, cough, sputum, wheezing.  GI: See history of present illness. GU:  Negative for dysuria, hematuria, urinary incontinence, urinary frequency, nocturnal urination.  Endo: Negative for unusual weight change.    Physical Examination:   BP (!) 106/59   Pulse 87   Temp (!) 96.3 F (35.7 C) (Oral)   Ht 5\' 3"  (1.6 m)   Wt 230 lb 6.4 oz (104.5 kg)   BMI 40.81 kg/m   General: Well-nourished, well-developed in no acute distress.  Eyes: No icterus. Mouth: Oropharyngeal mucosa moist and pink , no lesions erythema or exudate. Lungs: Clear to auscultation bilaterally.  Heart: Regular rate and rhythm, no murmurs rubs or gallops.  Abdomen: Bowel sounds are normal, nontender, nondistended, no hepatosplenomegaly or masses, no abdominal bruits or hernia , no rebound or guarding.   Extremities: No lower extremity edema. No clubbing or deformities. Neuro: Alert and oriented x 4   Skin: Warm and dry, no jaundice.     Psych: Alert and cooperative, normal mood and affect.   Imaging Studies: No results found.

## 2018-04-11 NOTE — Patient Instructions (Addendum)
1. Keep a journal listing what your bowel movements are like for the day and if you took [place]. 2. Take Amitiza one capsule EVERY OTHER DAY.  3. Try to eat small low fat meals/snacks every 3-4 hours throughout the day even if you are not hungry.  4. I will request copy of recent labs for review. 5. Call with progress reports in 2-3 weeks.  6. Return to see Dr. Oneida Alar in four months.

## 2018-04-17 ENCOUNTER — Telehealth: Payer: Self-pay | Admitting: Gastroenterology

## 2018-04-17 NOTE — Telephone Encounter (Signed)
Please request copy of last labs done by PCP.

## 2018-04-17 NOTE — Telephone Encounter (Signed)
I faxed a request for last labs to Dr Sherre Scarlet office

## 2018-04-28 ENCOUNTER — Telehealth: Payer: Self-pay | Admitting: Gastroenterology

## 2018-04-28 NOTE — Telephone Encounter (Signed)
Received labs from PCP dated February 21, 2018 BUN 19, creatinine 0.87, total bilirubin 0.4, AST 13, ALT 17, alkaline phosphatase 71, albumin 3.3, B12 422, folate 11.2, TSH 1.29, hemoglobin 11.5 slightly low, hematocrit 36.7 slightly low, MCV 91.3, platelets 253,000, white blood cell count 7400.   Would recommend CBC, ferritin, iron, tibc in four weeks. Please arrange. Dx: anemia.

## 2018-05-01 NOTE — Telephone Encounter (Signed)
LMOM to call.

## 2018-05-02 ENCOUNTER — Other Ambulatory Visit: Payer: Self-pay

## 2018-05-02 DIAGNOSIS — D508 Other iron deficiency anemias: Secondary | ICD-10-CM

## 2018-05-02 NOTE — Telephone Encounter (Signed)
PT is aware and I have mailed the lab orders to her to do at her PCP's office on or around 06/02/2018.

## 2018-05-08 ENCOUNTER — Telehealth: Payer: Self-pay

## 2018-05-08 NOTE — Telephone Encounter (Signed)
Pt called asking if our office called her. She said she missed her appointment previously and needs someone to call. Routing to DS.

## 2018-05-08 NOTE — Telephone Encounter (Signed)
PT is aware that I have not tried to call her today. She is aware that her lab orders were mailed around 05/02/2018. They are due to be done around 06/02/2018.  She will call if she does not get them in the mail, since she plans to do at her PCP's.

## 2018-07-12 ENCOUNTER — Other Ambulatory Visit: Payer: Self-pay | Admitting: Gastroenterology

## 2018-08-01 ENCOUNTER — Encounter: Payer: Self-pay | Admitting: Gastroenterology

## 2018-08-08 ENCOUNTER — Telehealth: Payer: Self-pay | Admitting: Gastroenterology

## 2018-08-08 NOTE — Telephone Encounter (Signed)
PATIENT CALLED INQUIRING IF SHE SENT A 600$ CHECK HERE TO PAY FOR HER PROCEDURE, I TOLD HER THAT I WAS NOT AWARE OF RECEIVING A CHECK FOR THAT AMOUNT AND THAT IT WOULD HAVE TO GO TO THE PAYMENT ADDRESS ON THE BILL. I TOLD HER I WOULD ASK MY OFFICE MANAGER IF SHE HAS RECEIVED ONE.  EXPLAINED TO THE PATIENT THAT IF SHE CAN NOT REMEMBER WHERE SHE SENT THE CHECK THAT SHE SHOULD MAYBE CALL HER BANK AND SEE IF THEY CAN TRACE IT.

## 2018-08-08 NOTE — Telephone Encounter (Addendum)
I tried to call the patient, no answer,lmom 

## 2019-06-12 ENCOUNTER — Telehealth: Payer: Self-pay | Admitting: Gastroenterology

## 2019-06-12 NOTE — Telephone Encounter (Signed)
Would recommend she go to the ER for black stools and vomiting. May have upper gi bleed.

## 2019-06-12 NOTE — Telephone Encounter (Signed)
Called pt lmom 

## 2019-06-12 NOTE — Telephone Encounter (Signed)
Called lmom

## 2019-06-12 NOTE — Telephone Encounter (Signed)
Pt stated this started 1 week ago and she has had diarrhea ever since. She stated the black stools started started about 4 days ago and there is a lot of it. Pt states she is having pain in her tummy all over and her left hip is hurting. She also stated she has been vomiting for 4 days as well

## 2019-06-12 NOTE — Telephone Encounter (Signed)
[phone] , PLEASE CALL PATIENT, SHE IS UPSET.  IS HAVING DIARRHEA AND BLACK STOOLS.

## 2019-06-13 ENCOUNTER — Emergency Department (HOSPITAL_COMMUNITY)
Admission: EM | Admit: 2019-06-13 | Discharge: 2019-06-13 | Disposition: A | Discharge status: Home / Self Care | Payer: Medicare Other | Attending: Emergency Medicine | Admitting: Emergency Medicine

## 2019-06-13 ENCOUNTER — Encounter (HOSPITAL_COMMUNITY): Payer: Self-pay | Admitting: *Deleted

## 2019-06-13 ENCOUNTER — Other Ambulatory Visit: Payer: Self-pay

## 2019-06-13 ENCOUNTER — Emergency Department (HOSPITAL_COMMUNITY): Payer: Medicare Other

## 2019-06-13 DIAGNOSIS — K921 Melena: Secondary | ICD-10-CM | POA: Diagnosis not present

## 2019-06-13 DIAGNOSIS — Z7984 Long term (current) use of oral hypoglycemic drugs: Secondary | ICD-10-CM | POA: Diagnosis not present

## 2019-06-13 DIAGNOSIS — R112 Nausea with vomiting, unspecified: Secondary | ICD-10-CM | POA: Diagnosis present

## 2019-06-13 DIAGNOSIS — E119 Type 2 diabetes mellitus without complications: Secondary | ICD-10-CM | POA: Insufficient documentation

## 2019-06-13 DIAGNOSIS — R197 Diarrhea, unspecified: Secondary | ICD-10-CM | POA: Diagnosis not present

## 2019-06-13 DIAGNOSIS — R1033 Periumbilical pain: Secondary | ICD-10-CM | POA: Insufficient documentation

## 2019-06-13 DIAGNOSIS — Z87891 Personal history of nicotine dependence: Secondary | ICD-10-CM | POA: Diagnosis not present

## 2019-06-13 DIAGNOSIS — Z7982 Long term (current) use of aspirin: Secondary | ICD-10-CM | POA: Insufficient documentation

## 2019-06-13 DIAGNOSIS — Z96652 Presence of left artificial knee joint: Secondary | ICD-10-CM | POA: Diagnosis not present

## 2019-06-13 DIAGNOSIS — I10 Essential (primary) hypertension: Secondary | ICD-10-CM | POA: Insufficient documentation

## 2019-06-13 DIAGNOSIS — Z79899 Other long term (current) drug therapy: Secondary | ICD-10-CM | POA: Diagnosis not present

## 2019-06-13 DIAGNOSIS — Z85828 Personal history of other malignant neoplasm of skin: Secondary | ICD-10-CM | POA: Diagnosis not present

## 2019-06-13 LAB — CBC WITH DIFFERENTIAL/PLATELET
Abs Immature Granulocytes: 0.02 10*3/uL (ref 0.00–0.07)
Basophils Absolute: 0 10*3/uL (ref 0.0–0.1)
Basophils Relative: 1 %
Eosinophils Absolute: 0.2 10*3/uL (ref 0.0–0.5)
Eosinophils Relative: 2 %
HCT: 36.4 % (ref 36.0–46.0)
Hemoglobin: 11.7 g/dL — ABNORMAL LOW (ref 12.0–15.0)
Immature Granulocytes: 0 %
Lymphocytes Relative: 28 %
Lymphs Abs: 2.4 10*3/uL (ref 0.7–4.0)
MCH: 28.7 pg (ref 26.0–34.0)
MCHC: 32.1 g/dL (ref 30.0–36.0)
MCV: 89.4 fL (ref 80.0–100.0)
Monocytes Absolute: 0.7 10*3/uL (ref 0.1–1.0)
Monocytes Relative: 8 %
Neutro Abs: 5.1 10*3/uL (ref 1.7–7.7)
Neutrophils Relative %: 61 %
Platelets: 281 10*3/uL (ref 150–400)
RBC: 4.07 MIL/uL (ref 3.87–5.11)
RDW: 13.4 % (ref 11.5–15.5)
WBC: 8.5 10*3/uL (ref 4.0–10.5)
nRBC: 0 % (ref 0.0–0.2)

## 2019-06-13 LAB — COMPREHENSIVE METABOLIC PANEL
ALT: 16 U/L (ref 0–44)
AST: 16 U/L (ref 15–41)
Albumin: 3.4 g/dL — ABNORMAL LOW (ref 3.5–5.0)
Alkaline Phosphatase: 62 U/L (ref 38–126)
Anion gap: 6 (ref 5–15)
BUN: 18 mg/dL (ref 8–23)
CO2: 24 mmol/L (ref 22–32)
Calcium: 8.8 mg/dL — ABNORMAL LOW (ref 8.9–10.3)
Chloride: 112 mmol/L — ABNORMAL HIGH (ref 98–111)
Creatinine, Ser: 0.85 mg/dL (ref 0.44–1.00)
GFR calc Af Amer: >60 mL/min (ref >60)
GFR calc non Af Amer: >60 mL/min (ref >60)
Glucose, Bld: 123 mg/dL — ABNORMAL HIGH (ref 70–99)
Potassium: 3.9 mmol/L (ref 3.5–5.1)
Sodium: 142 mmol/L (ref 135–145)
Total Bilirubin: 0.4 mg/dL (ref 0.3–1.2)
Total Protein: 6.2 g/dL — ABNORMAL LOW (ref 6.5–8.1)

## 2019-06-13 LAB — SAMPLE TO BLOOD BANK

## 2019-06-13 LAB — LIPASE, BLOOD: Lipase: 33 U/L (ref 11–51)

## 2019-06-13 LAB — POC OCCULT BLOOD, ED: Fecal Occult Bld: NEGATIVE

## 2019-06-13 MED ORDER — ONDANSETRON 4 MG PO TBDP: 4.0000 mg | ORAL_TABLET | Freq: Three times a day (TID) | ORAL | 0 refills | Status: DC | PRN

## 2019-06-13 NOTE — Discharge Instructions (Signed)
Your labs and CT imaging tonight are reassuring and you are Hemoccult negative.  I suspect the dark stools you have seen may be a side effect of the Pepto-Bismol you have been taking.  Make sure you continue to take your Prilosec and plan to see Dr. Oneida Alar on Monday as you have scheduled.  I have prescribed you Zofran for use if your nausea persists.

## 2019-06-13 NOTE — Telephone Encounter (Signed)
Pt called and stated she will have her son bring her to [facility] er, she states she has been asleep all day and very she is very tired. pt  lives in [place] but states she will go to the hospital shortly

## 2019-06-13 NOTE — Telephone Encounter (Signed)
Called lmom

## 2019-06-13 NOTE — ED Triage Notes (Signed)
Pt with mid abd pain for 2 weeks with N/V/D.  Denies any recent sick contact.

## 2019-06-13 NOTE — ED Provider Notes (Signed)
[facility] EMERGENCY DEPARTMENT Provider Note   CSN: [id number] Arrival date & time: 06/13/19  1817     History Chief Complaint  Patient presents with  . Abdominal Pain    Tammy Love is a 74 y.o. female with a history of DM, GERD, HTN, hypercholesterolemia presenting with a 1 week history of nausea, vomiting and diarrhea, reports several episodes of loose stools daily which have turned black in coloration, nausea with poor appetite and emesis, although has been able to maintain fluid intake and small bites of food.  She has periumbilical cramping pain that has been waxing and waning.  She denies fevers, chills, recent abx use, travel, or exposures to others with similar sx.  She called her GI specialist Dr. Gala Romney and was advised to present here for further evaluation. She has taken pepto bismol which has improved the diarrhea. She states her stools turned dark after starting this medicine.  She denies increased GERD sx recently.    The history is provided by the patient.       Past Medical History:  Diagnosis Date  . Arthritis   . Asthma   . Bipolar disorder ([place])   . Cancer ([place])    skin cancer on hands  . Diabetes ([place])   . GERD (gastroesophageal reflux disease)   . Glaucoma   . Headache   . Heart murmur   . Hiatal hernia   . Hypercholesterolemia   . Hypertension   . Sinus disease     Patient Active Problem List   Diagnosis Date Noted  . Nausea with vomiting 04/28/2017  . Diarrhea 04/28/2017  . Abdominal pain, epigastric 04/28/2017  . Loss of weight 04/28/2017  . Abdominal fullness 03/31/2016  . Constipation 03/31/2016  . FH: colon cancer 03/31/2016    Past Surgical History:  Procedure Laterality Date  . ABDOMINAL HYSTERECTOMY    . BIOPSY  09/27/2017   Procedure: BIOPSY;  Surgeon: Danie Binder, MD;  Location: [facility];  Service: Endoscopy;;  random colon, duodenal, gastric  . colonoscopy  2011   Dr. Earley Brooke: mucosa up to terminal ileum appeared normal,  biopsies from TI and rectum. MICROSCOPIC COLITIS   . COLONOSCOPY WITH PROPOFOL N/A 09/27/2017   Dr. Oneida Alar: Terminal ileum normal.  Diverticulosis.  Internal hemorrhoids. Five 4-56mm polyps, two 5-25mm polyps removed, tubular adenomas. random colon bx negative. next TCS 3 years.   Marland Kitchen DILATION AND CURETTAGE OF UTERUS     x3  . ESOPHAGOGASTRODUODENOSCOPY (EGD) WITH PROPOFOL N/A 09/27/2017   Dr. Oneida Alar: LA grade B esophagitis, diffuse inflammation with hemorrhage, congestion, erosions in the stomach.  Diffuse severe inflammation with erosions, congestion and shallow ulcerations in the entire duodenum.  Severe duodenitis with erosions, reactive gastropathy.  No H. pylori.  Marland Kitchen NASAL SINUS SURGERY    . POLYPECTOMY  09/27/2017   Procedure: POLYPECTOMY;  Surgeon: Danie Binder, MD;  Location: [facility];  Service: Endoscopy;;  . REPLACEMENT TOTAL KNEE     left  . TONSILLECTOMY    . TUBAL LIGATION       OB History   No obstetric history on file.     Family History  Problem Relation Age of Onset  . Colon cancer Sister [place]   . Hypertension Sister     Social History   Tobacco Use  . Smoking status: Former Smoker    Packs/day: 2.00    Years: 20.00    Pack years: 40.00  Types: Cigarettes    Quit date: 04/08/2004    Years since quitting: 15.1  . Smokeless tobacco: Never Used  Substance Use Topics  . Alcohol use: No  . Drug use: No    Home Medications Prior to Admission medications   Medication Sig Start Date End Date Taking? Authorizing Provider  acetaminophen (TYLENOL) 500 MG tablet Take 500 mg by mouth every 6 (six) hours as needed for mild pain or headache.    Yes [provider]  albuterol (PROVENTIL HFA;VENTOLIN HFA) 108 (90 Base) MCG/ACT inhaler Inhale 2 puffs into the lungs every 6 (six) hours as needed for wheezing or shortness of breath.   Yes [provider]  AMITIZA 8 MCG capsule TAKE 1 CAPSULE BY MOUTH TWICE DAILY WITH  A   MEAL Patient taking differently: Take 8 mcg by mouth daily as needed for constipation.  07/14/18  Yes Carlis Stable, NP  aspirin EC 81 MG tablet Take 81 mg by mouth at bedtime.    Yes [provider]  atorvastatin (LIPITOR) 80 MG tablet Take 80 mg by mouth at bedtime.    Yes [provider]  Cholecalciferol (VITAMIN D3) 2000 units TABS Take 2,000 Units by mouth daily.   Yes [provider]  fluticasone (FLONASE) 50 MCG/ACT nasal spray Place 2 sprays into both nostrils daily as needed for allergies or rhinitis.    Yes [provider]  Glucosamine-Chondroitin (GLUCOSAMINE CHONDR COMPLEX PO) Take 2 tablets by mouth daily.    Yes [provider]  ketoconazole (NIZORAL) 2 % cream Apply 1 application topically daily as needed for irritation (foot athlete's foot/itching).    Yes [provider]  lisinopril (PRINIVIL,ZESTRIL) 10 MG tablet Take 10 mg by mouth daily.   Yes [provider]  Multiple Vitamin (MULTIVITAMIN WITH MINERALS) TABS tablet Take 1 tablet by mouth daily. Centrum Silver   Yes [provider]  omeprazole (PRILOSEC) 20 MG capsule Take 20 mg by mouth daily before breakfast.   Yes [provider]  potassium chloride (MICRO-K) 10 MEQ CR capsule Take 10 mEq by mouth daily.    Yes [provider]  QUEtiapine (SEROQUEL) 25 MG tablet Take 25 mg by mouth at bedtime.   Yes [provider]  sertraline (ZOLOFT) 50 MG tablet Take 50 mg by mouth at bedtime.    Yes [provider]  sitaGLIPtin (JANUVIA) 100 MG tablet Take 100 mg by mouth daily.   Yes [provider]  sodium chloride (OCEAN) 0.65 % SOLN nasal spray Place 1 spray into both nostrils 2 (two) times daily.   Yes [provider]  sucralfate (CARAFATE) 1 GM/10ML suspension Take 1 g by mouth 4 (four) times daily as needed (for indigestion.).    Yes [provider]  SYMBICORT 80-4.5 MCG/ACT inhaler Inhale 1-2 puffs  into the lungs daily.  04/06/17  Yes [provider]  topiramate (TOPAMAX) 100 MG tablet Take 100-300 mg by mouth See admin instructions. Take 1 tablet (100 mg) by mouth in the morning, and 3 tablet (300 mg) by mouth at bedtime 04/07/17  Yes [provider]  ondansetron (ZOFRAN ODT) 4 MG disintegrating tablet Take 1 tablet (4 mg total) by mouth every 8 (eight) hours as needed for nausea or vomiting. 06/13/19   Remee Charley, Almyra Free, PA-C    Allergies    Ivp dye [iodinated diagnostic agents], Amoxicillin-pot clavulanate, Lincocin [lincomycin hcl], Nitrofurantoin, Penicillins, and Sulfa antibiotics  Review of Systems   Review of Systems  Constitutional:  Negative for fever.  HENT: Negative for congestion and sore throat.   Eyes: Negative.   Respiratory: Negative for chest tightness and shortness of breath.   Cardiovascular: Negative for chest pain.  Gastrointestinal: Positive for abdominal pain, diarrhea, nausea and vomiting.  Genitourinary: Negative.   Musculoskeletal: Negative for arthralgias, joint swelling and neck pain.  Skin: Negative.  Negative for rash and wound.  Neurological: Negative for dizziness, weakness, light-headedness, numbness and headaches.  Psychiatric/Behavioral: Negative.     Physical Exam Updated Vital Signs BP (!) 124/54   Pulse 72   Temp 97.6 F (36.4 C) (Oral)   Resp 18   Ht 5\' 3"  (1.6 m)   Wt 104.3 kg   SpO2 100%   BMI 40.74 kg/m   Physical Exam Vitals and nursing note reviewed. Exam conducted with a chaperone present.  Constitutional:      Appearance: She is well-developed.  HENT:     Head: Normocephalic and atraumatic.  Eyes:     Conjunctiva/sclera: Conjunctivae normal.  Cardiovascular:     Rate and Rhythm: Normal rate and regular rhythm.     Heart sounds: Normal heart sounds.  Pulmonary:     Effort: Pulmonary effort is normal.     Breath sounds: Normal breath sounds. No wheezing.  Abdominal:     General: Bowel sounds are normal.      Palpations: Abdomen is soft.     Tenderness: There is generalized abdominal tenderness. There is no guarding or rebound.  Genitourinary:    Rectum: Guaiac result negative.  Musculoskeletal:        General: Normal range of motion.     Cervical back: Normal range of motion.  Skin:    General: Skin is warm and dry.  Neurological:     Mental Status: She is alert.     ED Results / Procedures / Treatments   Labs (all labs ordered are listed, but only abnormal results are displayed) Labs Reviewed  CBC WITH DIFFERENTIAL/PLATELET - Abnormal; Notable for the following components:      Result Value   Hemoglobin 11.7 (*)    All other components within normal limits  COMPREHENSIVE METABOLIC PANEL - Abnormal; Notable for the following components:   Chloride 112 (*)    Glucose, Bld 123 (*)    Calcium 8.8 (*)    Total Protein 6.2 (*)    Albumin 3.4 (*)    All other components within normal limits  LIPASE, BLOOD  POC OCCULT BLOOD, ED  SAMPLE TO BLOOD BANK    EKG None  Radiology CT ABDOMEN PELVIS WO CONTRAST  Result Date: 06/13/2019 CLINICAL DATA:  Mid abdominal pain with nausea vomiting and diarrhea EXAM: CT ABDOMEN AND PELVIS WITHOUT CONTRAST TECHNIQUE: Multidetector CT imaging of the abdomen and pelvis was performed following the standard protocol without IV contrast. COMPARISON:  CT 04/08/2016 FINDINGS: Lower chest: Lung bases demonstrate no acute consolidation or pleural effusion. Hepatobiliary: No focal liver abnormality is seen. Status post cholecystectomy. No biliary dilatation. Pancreas: Unremarkable. No pancreatic ductal dilatation or surrounding inflammatory changes. Spleen: Normal in size without focal abnormality. Adrenals/Urinary Tract: Adrenal glands are normal. Kidneys show no hydronephrosis. Probable intrarenal vascular calcification on the left. The bladder is nearly empty Stomach/Bowel: Stomach is nonenlarged. Mild thickening at the duodenal bulb. No dilated small bowel. No  bowel wall thickening. Negative appendix. Vascular/Lymphatic: Mild aortic atherosclerosis. No aneurysm. Stable 14 mm gastrohepatic lymph node. Reproductive: Status post hysterectomy. No adnexal masses. Other: Negative for free air or free fluid. Small  fat containing umbilical hernia Musculoskeletal: No acute or significant osseous findings. IMPRESSION: 1. Questionable mild duodenal bulb thickening though without significant inflammatory process, question peptic ulcer disease. 2. Otherwise no CT evidence for acute intra-abdominal or pelvic abnormality 3. Stable prominent gastrohepatic lymph node presumed benign given lack of change since 2018 Electronically Signed   By: Donavan Foil M.D.   On: 06/13/2019 22:25    Procedures Procedures (including critical care time)  Medications Ordered in ED Medications - No data to display  ED Course  I have reviewed the triage vital signs and the nursing notes.  Pertinent labs & imaging results that were available during my care of the patient were reviewed by me and considered in my medical decision making (see chart for details).    MDM Rules/Calculators/A&P                      Labs and imaging reviewed and discussed with pt. Reassuring with hemoccult negative findings, suspect dark stools secondary to pepto bismol use.  She had no vomiting or diarrhea while here. CT imaging suggestive of thickening at the duodenal bulb, possibly reflective of PUD.  Pt is already on PPI, encouraged continued use and f/u with GI for further eval/management.  She was prescribed zofran for n/v sx.    Pt seen by Dr. Rogene Houston during this visit. Final Clinical Impression(s) / ED Diagnoses Final diagnoses:  Periumbilical abdominal pain  Non-intractable vomiting with nausea, unspecified vomiting type    Rx / DC Orders ED Discharge Orders         Ordered    ondansetron (ZOFRAN ODT) 4 MG disintegrating tablet  Every 8 hours PRN     06/13/19 2329           Evalee Jefferson, PA-C 06/15/19 1351    Fredia Sorrow, MD 06/20/19 [phone]

## 2019-06-13 NOTE — ED Provider Notes (Signed)
Medical screening examination/treatment/procedure(s) were conducted as a shared visit with non-physician practitioner(s) and myself.  I personally evaluated the patient during the encounter.      Patient seen by me along with physician assistant.  Patient was referred in for black stools.  There was concerns about GI blood loss.  But patient has been taking Pepto-Bismol.  Patient's Hemoccult was negative here hemoglobin is 11.7.  CT scan of the abdomen does raise concerns for inflammation in the first part of the small intestines which does raise concern for peptic ulcer disease.  Patient is already on omeprazole.  She will continue that.  She will follow-up with Dr. Dayton Scrape that she has an appointment on Monday for already.  Patient nontoxic no acute distress   Fredia Sorrow, MD 06/13/19 [phone]

## 2019-06-14 NOTE — Telephone Encounter (Signed)
Patient called and confirmed appt for Monday with [facility]

## 2019-06-14 NOTE — Telephone Encounter (Signed)
Patient was seen in ER yesterday. CT and labs reviewed. Black stool possibly due to Pepto Bismol. Hgb slightly low and some question of duodenal bulb abnormality on CT.   She has appt next week and was advised to keep it per ER.

## 2019-06-18 ENCOUNTER — Ambulatory Visit: Payer: Medicare Other | Admitting: Gastroenterology

## 2019-06-18 ENCOUNTER — Other Ambulatory Visit: Payer: Self-pay

## 2019-07-05 ENCOUNTER — Telehealth: Payer: Self-pay | Admitting: *Deleted

## 2019-07-05 NOTE — Telephone Encounter (Signed)
Tammy Love, you are scheduled for a virtual visit with your provider today.  Just as we do with appointments in the office, we must obtain your consent to participate.  Your consent will be active for this visit and any virtual visit you may have with one of our providers in the next 365 days.  If you have a MyChart account, I can also send a copy of this consent to you electronically.  All virtual visits are billed to your insurance company just like a traditional visit in the office.  As this is a virtual visit, video technology does not allow for your provider to perform a traditional examination.  This may limit your provider's ability to fully assess your condition.  If your provider identifies any concerns that need to be evaluated in person or the need to arrange testing such as labs, EKG, etc, we will make arrangements to do so.  Although advances in technology are sophisticated, we cannot ensure that it will always work on either your end or our end.  If the connection with a video visit is poor, we may have to switch to a telephone visit.  With either a video or telephone visit, we are not always able to ensure that we have a secure connection.   I need to obtain your verbal consent now.   Are you willing to proceed with your visit today?

## 2019-07-05 NOTE — Telephone Encounter (Signed)
Pt consented to a telephone visit. °

## 2019-07-06 ENCOUNTER — Encounter: Payer: Self-pay | Admitting: Gastroenterology

## 2019-07-06 ENCOUNTER — Ambulatory Visit (INDEPENDENT_AMBULATORY_CARE_PROVIDER_SITE_OTHER): Payer: Medicare Other | Admitting: Gastroenterology

## 2019-07-06 DIAGNOSIS — R197 Diarrhea, unspecified: Secondary | ICD-10-CM | POA: Diagnosis not present

## 2019-07-06 NOTE — Patient Instructions (Addendum)
1. Please collect stool and send to lab as soon as you can. We need to rule out infection.  2. Get blood work done same time you get your stool test done. 3. You can use imodium 2mg  every morning to prevent diarrhea and add second one in afternoon if needed. If you get constipated, hold imodium.

## 2019-07-06 NOTE — Progress Notes (Signed)
Primary Care Physician:  Keane Police, MD Primary GI:  Formerly Barney Drain, MD   Patient Location: Home  Provider Location: [facility] office  Reason for Phone Visit:  Chief Complaint  Patient presents with  . Diarrhea    Loose stools yellow in color, food going straight through her     Persons present on the phone encounter, with roles: Patient, myself (provider),Angela Stallings CMA (updated meds and allergies)  Total time (minutes) spent on medical discussion: 15 minutes  Due to COVID-19, visit was conducted using telephonic method (no video was available).  Visit was requested by patient.  Virtual Visit via Telephone only  I connected with Tammy Love on 07/06/19 at 11:00 AM EDT by telephone and verified that I am speaking with the correct person using two identifiers.   I discussed the limitations, risks, security and privacy concerns of performing an evaluation and management service by telephone and the availability of in person appointments. I also discussed with the patient that there may be a patient responsible charge related to this service. The patient expressed understanding and agreed to proceed.   HPI:   Patient is a pleasant 74 y/o female who presents for telephone visit regarding diarrhea.  Patient was last seen in March 2020.  She has a history of gastritis, severe duodenitis likely due to aspirin and Celebrex use in August 2019 at time of EGD.  She had a colonoscopy at the same time, 7 simple adenomas removed from her colon, random colon biopsies negative.  Next colonoscopy planned for 3 years.  Historically has significant constipation.  Would have to digitally disimpact herself.  She had been on Amitiza, taking as needed, generally about 4 times a week.  When she took it she would take twice a day.  Often would have blowouts but previously reluctant to change in her bowel regimen.    Seen in the emergency department on May 5 with complaints of black  stool.  Heme-negative.  Hemoglobin 11.7 slightly low, normal hematocrit at 36.4.  BUN 18, creatinine 0.85.  It was suspected her black stools were related to Pepto-Bismol use which she was using for stomach burning and diarrhea. Eats whole grain bread instead of white bread because makes it hard to pass stools. Eating soft foods mostly. Tries to eat small meals. If eats too much, then has problems. Quit fried foods. Will take imodium if going to the bathroom too much. Taking imodium some every week. Stopped PeptoBismol. None since ER visit. Forgot that will make stools turned dark. No brbpr. Stomach burns sometimes. Sometimes burns at rectum. Stomach burning migrates. Some nausea again. Not taking amitiza right now. Needs some nausea medicine. Omeprazole daily. No celebrex. No asa. Tylenol for pain.   PCP gave RX for Prevalite but she has not picked it up yet. Also given Flagyl and Diflucan which she has finished.     Wt Readings from Last 3 Encounters:  06/13/19 230 lb (104.3 kg)  04/11/18 230 lb 6.4 oz (104.5 kg)  07/26/17 239 lb (108.4 kg)     Current Outpatient Medications  Medication Sig Dispense Refill  . acetaminophen (TYLENOL) 500 MG tablet Take 500 mg by mouth as needed for mild pain or headache.     . albuterol (PROVENTIL HFA;VENTOLIN HFA) 108 (90 Base) MCG/ACT inhaler Inhale 2 puffs into the lungs every 6 (six) hours as needed for wheezing or shortness of breath.    . AMITIZA 8 MCG capsule TAKE 1 CAPSULE BY  MOUTH TWICE DAILY WITH  A  MEAL (Patient taking differently: Take 8 mcg by mouth as needed for constipation. ) 60 capsule 3  . atorvastatin (LIPITOR) 80 MG tablet Take 80 mg by mouth at bedtime.     . Cholecalciferol (VITAMIN D3) 2000 units TABS Take 2,000 Units by mouth daily.    . fluticasone (FLONASE) 50 MCG/ACT nasal spray Place 2 sprays into both nostrils daily as needed for allergies or rhinitis.     . Glucosamine-Chondroitin (GLUCOSAMINE CHONDR COMPLEX PO) Take 2 tablets by  mouth daily.     Marland Kitchen lisinopril (PRINIVIL,ZESTRIL) 10 MG tablet Take 10 mg by mouth daily.    . Multiple Vitamin (MULTIVITAMIN WITH MINERALS) TABS tablet Take 1 tablet by mouth daily. Centrum Silver    . omeprazole (PRILOSEC) 20 MG capsule Take 20 mg by mouth daily before breakfast.    . ondansetron (ZOFRAN ODT) 4 MG disintegrating tablet Take 1 tablet (4 mg total) by mouth every 8 (eight) hours as needed for nausea or vomiting. (Patient taking differently: Take 4 mg by mouth as needed for nausea or vomiting. ) 20 tablet 0  . potassium chloride (MICRO-K) 10 MEQ CR capsule Take 10 mEq by mouth daily.     . QUEtiapine (SEROQUEL) 25 MG tablet Take 25 mg by mouth at bedtime.    . sertraline (ZOLOFT) 50 MG tablet Take 50 mg by mouth at bedtime.     . sitaGLIPtin (JANUVIA) 100 MG tablet Take 100 mg by mouth daily.    . sodium chloride (OCEAN) 0.65 % SOLN nasal spray Place 1 spray into both nostrils 2 (two) times daily.    . sucralfate (CARAFATE) 1 GM/10ML suspension Take 1 g by mouth 4 (four) times daily as needed (for indigestion.).     Marland Kitchen SYMBICORT 80-4.5 MCG/ACT inhaler Inhale 1-2 puffs into the lungs daily.     Marland Kitchen topiramate (TOPAMAX) 100 MG tablet Take 100-300 mg by mouth See admin instructions. Take 1 tablet (100 mg) by mouth in the morning, and 3 tablet (300 mg) by mouth at bedtime     No current facility-administered medications for this visit.    Past Medical History:  Diagnosis Date  . Arthritis   . Asthma   . Bipolar disorder ([place])   . Cancer ([place])    skin cancer on hands  . Diabetes ([place])   . GERD (gastroesophageal reflux disease)   . Glaucoma   . Headache   . Heart murmur   . Hiatal hernia   . Hypercholesterolemia   . Hypertension   . Sinus disease     Past Surgical History:  Procedure Laterality Date  . ABDOMINAL HYSTERECTOMY    . BIOPSY  09/27/2017   Procedure: BIOPSY;  Surgeon: Danie Binder, MD;  [facility]: [facility];  Service: Endoscopy;;  random colon, duodenal,  gastric  . colonoscopy  2011   Dr. Earley Brooke: mucosa up to terminal ileum appeared normal, biopsies from TI and rectum. MICROSCOPIC COLITIS   . COLONOSCOPY WITH PROPOFOL N/A 09/27/2017   Dr. Oneida Alar: Terminal ileum normal.  Diverticulosis.  Internal hemorrhoids. Five 4-70mm polyps, two 5-63mm polyps removed, tubular adenomas. random colon bx negative. next TCS 3 years.   Marland Kitchen DILATION AND CURETTAGE OF UTERUS     x3  . ESOPHAGOGASTRODUODENOSCOPY (EGD) WITH PROPOFOL N/A 09/27/2017   Dr. Oneida Alar: LA grade B esophagitis, diffuse inflammation with hemorrhage, congestion, erosions in the stomach.  Diffuse severe inflammation with erosions, congestion and shallow ulcerations in the entire duodenum.  Severe duodenitis with erosions, reactive gastropathy.  No H. pylori.  Marland Kitchen NASAL SINUS SURGERY    . POLYPECTOMY  09/27/2017   Procedure: POLYPECTOMY;  Surgeon: Danie Binder, MD;  Location: [facility];  Service: Endoscopy;;  . REPLACEMENT TOTAL KNEE     left  . TONSILLECTOMY    . TUBAL LIGATION      Family History  Problem Relation Age of Onset  . Colon cancer Sister [facility] [place]   . Hypertension Sister     Social History   Socioeconomic History  . Marital status: Widowed    Spouse name: Not on file  . Number of children: Not on file  . Years of education: Not on file  . Highest education level: Not on file  Occupational History  . Not on file  Tobacco Use  . Smoking status: Former Smoker    Packs/day: 2.00    Years: 20.00    Pack years: 40.00    Types: Cigarettes    Quit date: 04/08/2004    Years since quitting: 15.2  . Smokeless tobacco: Never Used  Substance and Sexual Activity  . Alcohol use: No  . Drug use: No  . Sexual activity: Not Currently    Birth control/protection: Surgical  Other Topics Concern  . Not on file  Social History Narrative  . Not on file   Social Determinants of Health   Financial Resource Strain:   . Difficulty of Paying Living Expenses:   Food  Insecurity:   . Worried About Charity fundraiser in the Last Year:   . Arboriculturist in the Last Year:   Transportation Needs:   . Film/video editor (Medical):   Marland Kitchen Lack of Transportation (Non-Medical):   Physical Activity:   . Days of Exercise per Week:   . Minutes of Exercise per Session:   Stress:   . Feeling of Stress :   Social Connections:   . Frequency of Communication with Friends and Family:   . Frequency of Social Gatherings with Friends and Family:   . Attends Religious Services:   . Active Member of Clubs or Organizations:   . Attends Archivist Meetings:   Marland Kitchen Marital Status:   Intimate Partner Violence:   . Fear of Current or Ex-Partner:   . Emotionally Abused:   Marland Kitchen Physically Abused:   . Sexually Abused:       ROS:  General: Negative for anorexia, weight loss, fever, chills, fatigue, weakness. Eyes: Negative for vision changes.  ENT: Negative for hoarseness, difficulty swallowing , nasal congestion. CV: Negative for chest pain, angina, palpitations, dyspnea on exertion, peripheral edema.  Respiratory: Negative for dyspnea at rest, dyspnea on exertion, cough, sputum, wheezing.  GI: See history of present illness. GU:  Negative for dysuria, hematuria, urinary incontinence, urinary frequency, nocturnal urination.  MS: Negative for joint pain, low back pain.  Derm: Negative for rash or itching.  Neuro: Negative for weakness, abnormal sensation, seizure, frequent headaches, memory loss, confusion.  Psych: Negative for anxiety, depression, suicidal ideation, hallucinations.  Endo: Negative for unusual weight change.  Heme: Negative for bruising or bleeding. Allergy: Negative for rash or hives.   Observations/Objective: Pleasant, alert, cooperative in no acute distress.  Otherwise exam unavailable.  Lab Results  Component Value Date   CREATININE 0.85 06/13/2019   BUN 18 06/13/2019   NA 142 06/13/2019   K 3.9 06/13/2019   CL 112 (H) 06/13/2019  CO2 24 06/13/2019   Lab Results  Component Value Date   ALT 16 06/13/2019   AST 16 06/13/2019   ALKPHOS 62 06/13/2019   BILITOT 0.4 06/13/2019   Lab Results  Component Value Date   WBC 8.5 06/13/2019   HGB 11.7 (L) 06/13/2019   HCT 36.4 06/13/2019   MCV 89.4 06/13/2019   PLT 281 06/13/2019    Assessment and Plan: Pleasant 74 year old female presenting for further evaluation of diarrhea.  She has a long history of constipation with requirement for digital disimpaction, has been maintained on Amitiza 8 mcg twice daily.  Patient has been able to take Amitiza only every few days because of significant loose stool but previously would like to to change regimen because of fear of significant constipation again.  At this point she has not been taking Amitiza and continues to have frequent postprandial loose stools.  Given persistent diarrhea, obtain stool studies to rule out infection.  We will also follow-up on CBC due to mildly low hemoglobin, check celiac serologies.  After stool studies obtained she can take Imodium 2 mg every morning to prevent diarrhea and take a second 1 in the afternoon if needed.  Hold for constipation.  Further recommendations to follow.  Follow Up Instructions:    I discussed the assessment and treatment plan with the patient. The patient was provided an opportunity to ask questions and all were answered. The patient agreed with the plan and demonstrated an understanding of the instructions. AVS mailed to patient's home address.   The patient was advised to call back or seek an in-person evaluation if the symptoms worsen or if the condition fails to improve as anticipated.  I provided 15 minutes of non-face-to-face time during this encounter.   Neil Crouch, PA-C

## 2019-07-10 ENCOUNTER — Telehealth: Payer: Self-pay | Admitting: Gastroenterology

## 2019-07-10 ENCOUNTER — Encounter: Payer: Self-pay | Admitting: Gastroenterology

## 2019-07-10 NOTE — Telephone Encounter (Signed)
[place] PATIENT LAB ORDER TO THIS NUMBER, [facility]

## 2019-07-10 NOTE — Telephone Encounter (Signed)
Faxed

## 2019-07-10 NOTE — Telephone Encounter (Signed)
Also, Faxed on 5/28. Called [facility] and verified it was received

## 2019-07-13 LAB — CBC WITH DIFFERENTIAL/PLATELET
Basophils Absolute: 0.1 10*3/uL (ref 0.0–0.2)
Basos: 1 %
EOS (ABSOLUTE): 0.2 10*3/uL (ref 0.0–0.4)
Eos: 2 %
Hematocrit: 39.5 % (ref 34.0–46.6)
Hemoglobin: 13.1 g/dL (ref 11.1–15.9)
Immature Grans (Abs): 0 10*3/uL (ref 0.0–0.1)
Immature Granulocytes: 0 %
Lymphocytes Absolute: 2.3 10*3/uL (ref 0.7–3.1)
Lymphs: 30 %
MCH: 29.4 pg (ref 26.6–33.0)
MCHC: 33.2 g/dL (ref 31.5–35.7)
MCV: 89 fL (ref 79–97)
Monocytes Absolute: 0.5 10*3/uL (ref 0.1–0.9)
Monocytes: 7 %
Neutrophils Absolute: 4.7 10*3/uL (ref 1.4–7.0)
Neutrophils: 60 %
Platelets: 275 10*3/uL (ref 150–450)
RBC: 4.46 x10E6/uL (ref 3.77–5.28)
RDW: 12.4 % (ref 11.7–15.4)
WBC: 7.7 10*3/uL (ref 3.4–10.8)

## 2019-07-13 LAB — TISSUE TRANSGLUTAMINASE, IGA: Transglutaminase IgA: <2 U/mL (ref 0–3)

## 2019-07-13 LAB — IGA: IgA/Immunoglobulin A, Serum: 210 mg/dL (ref 64–422)

## 2019-07-14 LAB — GI PROFILE, STOOL, PCR

## 2019-07-16 ENCOUNTER — Other Ambulatory Visit: Payer: Self-pay

## 2019-07-16 NOTE — Telephone Encounter (Signed)
Opened in error

## 2019-08-29 ENCOUNTER — Other Ambulatory Visit: Payer: Self-pay | Admitting: Gastroenterology

## 2019-08-29 MED ORDER — DICYCLOMINE HCL 10 MG PO CAPS: ORAL_CAPSULE | ORAL | 1 refills | Status: DC

## 2019-09-11 NOTE — Progress Notes (Signed)
Referring Provider: Keane Police, MD Primary Care Physician:  Keane Police, MD Primary GI Physician: Dr. Oneida Alar previously, pending Dr. Abbey Chatters  Chief Complaint  Patient presents with  . Diarrhea    will have blood and pus looking, varies at different times, last episode 2 days ago  . Nausea    w/ vomiting that comes/goes    HPI:   Tammy Love is a 74 y.o. female presenting today for follow-up of diarrhea. She has history of esopahgitis, erosive gastritis, severe erosive duodenitis likely due to aspirin and Celebrex use in August 2019 at the time of EGD. Colonoscopy at the same time with 7 simple adenomas removed from her colon, random colon biopsies negative. Recommended repeat colonoscopy in 3 years. Also with history of constipation on Amitiza 8 mcg historically.  Last seen via virtual visit 07/06/2019 for diarrhea. She has been seen in the emergency room May 5 with concerns for black stool. Heme-negative. Hemoglobin 11.7, slightly low. BUN 18, creatinine 0.5. The suspected black stools related to Pepto-Bismol which she was taking for stomach burning and diarrhea. Eating mostly soft foods. Eating small meals. Quit fried foods. Imodium as needed. Had stopped Pepto-Bismol. No bright red blood per rectum. Occasional stomach burning. Occasional rectal burning. Occasional nausea. On omeprazole daily. No Celebrex or ASA. She has been prescribed Flagyl and Diflucan which she had finished. Had also been prescribed cholestyramine by PCP had not picked this up yet. Plans to obtain stool studies, update CBC, check celiac serologies. After stool studies obtained, okay to take 2 Imodium every morning to prevent diarrhea and a second Imodium in the afternoon if needed.  Labs completed 07/11/2019: Celiac serologies negative, GI pathogen panel negative, CBC within normal limits. She continued with yellow/orange-colored loose stool, 5-6 BMs daily. Patient reported trying Imodium to 3 times a day which  was unhelpful. Prescription was sent for Bentyl on 7/21.  Today:  States all she had to do is stand up and the stool will run out. Tells me she has had diarrhea for years. About 6 BMs a day.  Nocturnal BMs as well. Sometimes watery, other times like pudding. Can be large or small in volume. Some incontinence when she is asleep. Some incontinence during the day due to urgency and inability to get to the bathroom in time.    Drinking milk several days a week. Eating cheese. Eats fasts foods. Rare fried foods.  Had diarrhea before cholecystectomy.   Blood in the stool 3-4 days ago. Also on toilet tissue. This was new. No rectal bleeding since then. Dark red.  Had what looked like mucous in her stool as well. Had had rectal burning at times. Occasional abdominal pain when she has diarrhea but not all the time. No pain today.   Taking 1 imodium in the morning and 1 at night. Taking dicyclomine before meals. Hasn't helped diarrhea.   One episode of vomiting 3-4 days ago. No hematemesis. Nausea comes on randomly. No identified trigger. Not any particular time of day. GERD is well controlled on omeprazole 20 mg daily. Weight is stable.    No NSAIDs.  CT A/P without contrast 06/13/2019 with mild thickening at the duodenal bulb.  No bowel wall thickening.   Past Medical History:  Diagnosis Date  . Arthritis   . Asthma   . Bipolar disorder ([place])   . Cancer ([place])    skin cancer on hands  . Diabetes ([place])   . GERD (gastroesophageal reflux disease)   . Glaucoma   .  Headache   . Heart murmur   . Hiatal hernia   . Hypercholesterolemia   . Hypertension   . Sinus disease     Past Surgical History:  Procedure Laterality Date  . ABDOMINAL HYSTERECTOMY    . BIOPSY  09/27/2017   Procedure: BIOPSY;  Surgeon: Danie Binder, MD;  [facility];  Service: Endoscopy;;  random colon, duodenal, gastric  . CHOLECYSTECTOMY    . colonoscopy  2011   Dr. Earley Brooke: mucosa up to terminal ileum  appeared normal, biopsies from TI and rectum. MICROSCOPIC COLITIS   . COLONOSCOPY WITH PROPOFOL N/A 09/27/2017   Dr. Oneida Alar: Terminal ileum normal.  Diverticulosis.  Internal hemorrhoids. Five 4-61mm polyps, two 5-55mm polyps removed, tubular adenomas. random colon bx negative. next TCS 3 years.   Marland Kitchen DILATION AND CURETTAGE OF UTERUS     x3  . ESOPHAGOGASTRODUODENOSCOPY (EGD) WITH PROPOFOL N/A 09/27/2017   Dr. Oneida Alar: LA grade B esophagitis, diffuse inflammation with hemorrhage, congestion, erosions in the stomach.  Diffuse severe inflammation with erosions, congestion and shallow ulcerations in the entire duodenum.  Severe duodenitis with erosions, reactive gastropathy.  No H. pylori.  Marland Kitchen NASAL SINUS SURGERY    . POLYPECTOMY  09/27/2017   Procedure: POLYPECTOMY;  Surgeon: Danie Binder, MD;  [facility];  Service: Endoscopy;;  . REPLACEMENT TOTAL KNEE     left  . TONSILLECTOMY    . TUBAL LIGATION      Current Outpatient Medications  Medication Sig Dispense Refill  . acetaminophen (TYLENOL) 500 MG tablet Take 500 mg by mouth as needed for mild pain or headache.     . albuterol (PROVENTIL HFA;VENTOLIN HFA) 108 (90 Base) MCG/ACT inhaler Inhale 2 puffs into the lungs every 6 (six) hours as needed for wheezing or shortness of breath.    Marland Kitchen atorvastatin (LIPITOR) 80 MG tablet Take 80 mg by mouth at bedtime.     . Cholecalciferol (VITAMIN D3) 2000 units TABS Take 2,000 Units by mouth daily.    Marland Kitchen dicyclomine (BENTYL) 10 MG capsule Take before meals up to three times daily to control diarrhea. 90 capsule 1  . fluticasone (FLONASE) 50 MCG/ACT nasal spray Place 2 sprays into both nostrils daily as needed for allergies or rhinitis.     . Glucosamine-Chondroitin (GLUCOSAMINE CHONDR COMPLEX PO) Take 2 tablets by mouth daily.     Marland Kitchen lisinopril (PRINIVIL,ZESTRIL) 10 MG tablet Take 10 mg by mouth daily.    Marland Kitchen loperamide (IMODIUM) 2 MG capsule Take 2 mg by mouth 2 (two) times daily as needed for diarrhea  or loose stools.    . Multiple Vitamin (MULTIVITAMIN WITH MINERALS) TABS tablet Take 1 tablet by mouth daily. Centrum Silver    . omeprazole (PRILOSEC) 20 MG capsule Take 20 mg by mouth daily before breakfast.    . potassium chloride (MICRO-K) 10 MEQ CR capsule Take 10 mEq by mouth daily.     . QUEtiapine (SEROQUEL) 25 MG tablet Take 25 mg by mouth at bedtime.    . sertraline (ZOLOFT) 50 MG tablet Take 50 mg by mouth at bedtime.     . sitaGLIPtin (JANUVIA) 100 MG tablet Take 100 mg by mouth daily.    . sodium chloride (OCEAN) 0.65 % SOLN nasal spray Place 1 spray into both nostrils 2 (two) times daily.    . SYMBICORT 80-4.5 MCG/ACT inhaler Inhale 1-2 puffs into the lungs daily.     Marland Kitchen topiramate (TOPAMAX) 100 MG tablet Take 100-300 mg by mouth  See admin instructions. Take 1 tablet (100 mg) by mouth in the morning, and 3 tablet (300 mg) by mouth at bedtime    . ondansetron (ZOFRAN) 4 MG tablet Take 1 tablet (4 mg total) by mouth every 8 (eight) hours as needed for nausea or vomiting. 30 tablet 0  . sucralfate (CARAFATE) 1 GM/10ML suspension Take 1 g by mouth 4 (four) times daily as needed (for indigestion.).  (Patient not taking: Reported on 09/12/2019)     No current facility-administered medications for this visit.    Allergies as of 09/12/2019 - Review Complete 09/12/2019  Allergen Reaction Noted  . Ivp dye [iodinated diagnostic agents] Anaphylaxis, Itching, Swelling, and Rash 03/31/2016  . Amoxicillin-pot clavulanate Itching and Rash 12/15/2009  . Lincocin [lincomycin hcl] Itching and Rash 04/06/2016  . Nitrofurantoin Itching and Rash 12/15/2009  . Penicillins Itching and Rash 12/15/2009  . Sulfa antibiotics Itching and Rash 12/15/2009    Family History  Problem Relation Age of Onset  . Colon cancer Sister [place]   . Hypertension Sister     Social History   Socioeconomic History  . Marital status: Widowed    Spouse name: Not on file  . Number of children: Not  on file  . Years of education: Not on file  . Highest education level: Not on file  Occupational History  . Not on file  Tobacco Use  . Smoking status: Former Smoker    Packs/day: 2.00    Years: 20.00    Pack years: 40.00    Types: Cigarettes    Quit date: 04/08/2004    Years since quitting: 15.4  . Smokeless tobacco: Never Used  Vaping Use  . Vaping Use: Never used  Substance and Sexual Activity  . Alcohol use: No  . Drug use: No  . Sexual activity: Not Currently    Birth control/protection: Surgical  Other Topics Concern  . Not on file  Social History Narrative  . Not on file   Social Determinants of Health   Financial Resource Strain:   . Difficulty of Paying Living Expenses:   Food Insecurity:   . Worried About Charity fundraiser in the Last Year:   . Arboriculturist in the Last Year:   Transportation Needs:   . Film/video editor (Medical):   Marland Kitchen Lack of Transportation (Non-Medical):   Physical Activity:   . Days of Exercise per Week:   . Minutes of Exercise per Session:   Stress:   . Feeling of Stress :   Social Connections:   . Frequency of Communication with Friends and Family:   . Frequency of Social Gatherings with Friends and Family:   . Attends Religious Services:   . Active Member of Clubs or Organizations:   . Attends Archivist Meetings:   Marland Kitchen Marital Status:     Review of Systems: Gen: Denies fever, chills, cold or flulike symptoms, lightheadedness, dizziness, presyncope, syncope. CV: Denies chest pain or palpitations. Resp: Denies dyspnea or cough. GI: See HPI Heme: See HPI  Physical Exam: BP 117/73   Pulse 88   Temp (!) 97.1 F (36.2 C)   Ht 5\' 3"  (1.6 m)   Wt 229 lb 12.8 oz (104.2 kg)   BMI 40.71 kg/m  General:   Alert and oriented. No distress noted. Pleasant and cooperative.  Head:  Normocephalic and atraumatic. Eyes:  Conjuctiva clear without scleral icterus. Heart:  S1, S2 present without  murmurs appreciated. Lungs:   Clear to auscultation bilaterally. No wheezes, rales, or rhonchi. No distress.  Abdomen:  +BS, soft, non-tender and non-distended. No rebound or guarding. No HSM or masses noted. Msk:  Symmetrical without gross deformities. Normal posture. Extremities:  Without edema. Neurologic:  Alert and  oriented x4 Psych:  Normal mood and affect.

## 2019-09-12 ENCOUNTER — Ambulatory Visit (INDEPENDENT_AMBULATORY_CARE_PROVIDER_SITE_OTHER): Payer: Medicare Other | Admitting: Gastroenterology

## 2019-09-12 ENCOUNTER — Encounter: Payer: Self-pay | Admitting: Gastroenterology

## 2019-09-12 ENCOUNTER — Other Ambulatory Visit: Payer: Self-pay

## 2019-09-12 ENCOUNTER — Other Ambulatory Visit (HOSPITAL_COMMUNITY)
Admission: RE | Admit: 2019-09-12 | Discharge: 2019-09-12 | Disposition: A | Discharge status: Home / Self Care | Payer: Medicare Other | Source: Ambulatory Visit | Attending: Gastroenterology | Admitting: Gastroenterology

## 2019-09-12 VITALS — BP 117/73 | HR 88 | Temp 97.1°F | Ht 63.0 in | Wt 229.8 lb

## 2019-09-12 DIAGNOSIS — R112 Nausea with vomiting, unspecified: Secondary | ICD-10-CM | POA: Diagnosis not present

## 2019-09-12 DIAGNOSIS — K625 Hemorrhage of anus and rectum: Secondary | ICD-10-CM | POA: Diagnosis present

## 2019-09-12 DIAGNOSIS — R197 Diarrhea, unspecified: Secondary | ICD-10-CM | POA: Insufficient documentation

## 2019-09-12 DIAGNOSIS — K2980 Duodenitis without bleeding: Secondary | ICD-10-CM | POA: Diagnosis not present

## 2019-09-12 LAB — CBC WITH DIFFERENTIAL/PLATELET
Abs Immature Granulocytes: 0.03 10*3/uL (ref 0.00–0.07)
Basophils Absolute: 0.1 10*3/uL (ref 0.0–0.1)
Basophils Relative: 1 %
Eosinophils Absolute: 0.2 10*3/uL (ref 0.0–0.5)
Eosinophils Relative: 2 %
HCT: 38.1 % (ref 36.0–46.0)
Hemoglobin: 12.2 g/dL (ref 12.0–15.0)
Immature Granulocytes: 0 %
Lymphocytes Relative: 28 %
Lymphs Abs: 2.8 10*3/uL (ref 0.7–4.0)
MCH: 29.6 pg (ref 26.0–34.0)
MCHC: 32 g/dL (ref 30.0–36.0)
MCV: 92.5 fL (ref 80.0–100.0)
Monocytes Absolute: 0.9 10*3/uL (ref 0.1–1.0)
Monocytes Relative: 9 %
Neutro Abs: 5.9 10*3/uL (ref 1.7–7.7)
Neutrophils Relative %: 60 %
Platelets: 257 10*3/uL (ref 150–400)
RBC: 4.12 MIL/uL (ref 3.87–5.11)
RDW: 13.2 % (ref 11.5–15.5)
WBC: 9.9 10*3/uL (ref 4.0–10.5)
nRBC: 0 % (ref 0.0–0.2)

## 2019-09-12 LAB — TSH: TSH: 0.816 u[IU]/mL (ref 0.350–4.500)

## 2019-09-12 LAB — BASIC METABOLIC PANEL
Anion gap: 11 (ref 5–15)
BUN: 25 mg/dL — ABNORMAL HIGH (ref 8–23)
CO2: 20 mmol/L — ABNORMAL LOW (ref 22–32)
Calcium: 9.2 mg/dL (ref 8.9–10.3)
Chloride: 105 mmol/L (ref 98–111)
Creatinine, Ser: 1.2 mg/dL — ABNORMAL HIGH (ref 0.44–1.00)
GFR calc Af Amer: 52 mL/min — ABNORMAL LOW (ref >60)
GFR calc non Af Amer: 45 mL/min — ABNORMAL LOW (ref >60)
Glucose, Bld: 126 mg/dL — ABNORMAL HIGH (ref 70–99)
Potassium: 3.8 mmol/L (ref 3.5–5.1)
Sodium: 136 mmol/L (ref 135–145)

## 2019-09-12 MED ORDER — ONDANSETRON HCL 4 MG PO TABS: 4.0000 mg | ORAL_TABLET | Freq: Three times a day (TID) | ORAL | 0 refills | Status: AC | PRN

## 2019-09-12 NOTE — Progress Notes (Signed)
WBC, hemoglobin, and platelets within normal limits. Electrolytes within normal limits. Kidney function has worsened, likely secondary to diarrhea. Thyroid function is normal but on the low end of normal.   Recommendations:  In addition to dietary adjustments recommended at her visit today, she needs to focus on increasing  her fluid intake. She should drink enough to keep her urine pale yellow to clear. Water, pedialyte, and G2 are good options in the setting of diarrhea. She will have to be careful with the G2 due to sugar content with history of diabetes.   She should follow-up with PCP on kidney function and thyroid function for continued monitoring.   Manuela Schwartz, please fax results to PCP.

## 2019-09-12 NOTE — Patient Instructions (Addendum)
We will get you scheduled for an upper endoscopy and colonoscopy in the near future with Dr. Abbey Chatters.  1 day prior to your procedures: take 1/2 dose of Januvia (50 mg).  Day of procedure: Do not take morning diabetes medications.  Please have labs completed.  Please stop Bentyl for now as this is not helping your diarrhea.  You may continue using Imodium as needed.  You may take 2 tablets in the morning then 1 after each loose stool.  Max of 4 tablets daily.  Please focus on dietary changes: Follow a lactose-free diet.  See handout below. Follow low-fat diet.  Avoid fried, fatty, greasy foods.  No fast food.  All meats should be lean (poultry or fish) and baked, boiled, or broiled.  If you have any profuse rectal bleeding, you should proceed to the emergency room.   Avoid all NSAIDs including ibuprofen, Aleve, Advil, Goody powders, and anything that says "NSAID" on the package.  Continue omeprazole 20 mg daily 30 minutes before first meal.   I have sent Zofran to your pharmacy to use as needed for nausea/vomiting.  Please call in 2 weeks with a progress report on how your diarrhea is doing with dietary changes.  We may consider trying a different medication while waiting on procedures.  We will follow up with you in the office after your procedures.  Do not hesitate to call with questions or concerns prior.  Aliene Altes, PA-C [facility]

## 2019-09-14 ENCOUNTER — Encounter: Payer: Self-pay | Admitting: Gastroenterology

## 2019-09-14 NOTE — Assessment & Plan Note (Signed)
Intermittent nausea with one episode of vomiting about 3-4 days ago.  Vomiting does not occur regularly.  No hematemesis.  No identified triggers of nausea.  History of GERD that is well controlled omeprazole 20 mg daily.  Weight is stable.  No NSAIDs.  Interestingly, CT A/P without contrast 06/13/2019 with mild thickening of the duodenal bulb.  Plan to pursue EGD in the near future for further evaluation of duodenal thickening.  She will continue on omeprazole 20 mg daily for now but may need this increase in the future pending findings.  To help with symptomatic relief while waiting on procedures, I have prescribed Zofran 4 mg every 8 hours as needed.  Plan to follow-up after procedure.

## 2019-09-14 NOTE — Assessment & Plan Note (Addendum)
Mild wall thickening of the duodenal bulb noted on CT A/P without contrast 06/13/2019.  History of erosive gastritis and severe erosive duodenitis likely due to aspirin and Celebrex in August 2019 at the time of EGD.  Patient is currently on omeprazole 20 mg daily for GERD which is well controlled and denies any NSAID use. She does note intermittent nausea without identified trigger.  1 episode of vomiting 3-4 days ago but nothing regularly.  No hematemesis.  Additionally, she has chronic diarrhea as per below.  Celiac serologies negative in June 2021.  She will need EGD for further evaluation.  Based on findings, she may need to increase her omeprazole.  Query whether duodenitis may be contributing to diarrhea.  Plan: Proceed with EGD with propofol with Dr. Abbey Chatters in the near future. The risks, benefits, and alternatives have been discussed in detail with patient. They have stated understanding and desire to proceed.  ASA III Continue omeprazole 20 mg daily 30 minutes before breakfast. Avoid all NSAIDs. Follow-up after EGD.

## 2019-09-14 NOTE — Assessment & Plan Note (Addendum)
Patient rectal bleeding 3 to 4 days ago with blood in the stool and on toilet tissue.  No prior rectal bleeding. This is in setting of persistent diarrhea.  Last colonoscopy in August 2019 with 7 simple adenomas, diverticulosis, and internal hemorrhoids.  Recommended repeat colonoscopy in 3 years.  Admits to mild rectal burning at times.  Also notes some stool incontinence during the night.  Also has incontinence during the day at times but this is due to urgency.  No unintentional weight loss.  Suspect rectal bleeding is likely secondary to benign anorectal source/trauma secondary to frequent bowel habits.  However, cannot rule out colon polyps or malignancy.  She will need colonoscopy for further evaluation.  Colonoscopy will also help to evaluate etiology of persistent diarrhea as well as stool incontinence.  Plan: Proceed with colonoscopy with propofol with Dr. Abbey Chatters in the near future. The risks, benefits, and alternatives have been discussed in detail with patient. They have stated understanding and desire to proceed.  ASA III See separate instructions for diabetes medication adjustments. Diarrhea addressed below. Advised that she proceed to the emergency room if she had profuse rectal bleeding. Follow-up after colonoscopy.

## 2019-09-14 NOTE — Assessment & Plan Note (Addendum)
74 year old female reporting chronic history of diarrhea.  Currently with about 6 BMs per day and nocturnal stools.  Incontinence while asleep and some urge incontinence during the day.  Notes an episode of rectal bleeding 3-4 days ago as well as some mucus in her stool.  Occasional abdominal pain with diarrhea but not regularly.  She is currently taking 1 Imodium in the morning 1 Imodium in the evening and Bentyl 10 mg before meals without improvement in diarrhea.  CT A/P without contrast 06/13/2019 with mild thickening of the duodenal bulb, no bowel wall thickening. Recent evaluation completed 07/11/2019 with celiac serologies negative, GI pathogen panel negative (includes C. diff), CBC within normal limits.  Last colonoscopy August 2019 with 7 simple adenomas, random colon biopsies negative.  Due for repeat in 2022.  She does have history of cholecystectomy but states diarrhea was present prior to cholecystectomy.  Also admits to consuming dairy regularly. No NSAIDs.   Differentials include bile salt diarrhea, dietary intolerances, thyroid abnormalities, microscopic colitis, IBD, SIBO, pancreatic insufficiency, thyroid abnormalities.  Duodenitis noted on CT could also be contributing.  Plan: Update CBC and BMP Check TSH Follow lactose-free diet Follow low-fat diet.  Avoid fried, fatty, greasy foods.  No fast food.  All meats should be lean (poultry or fish) and baked, boiled, or broiled. Continue Imodium as needed. Stop Bentyl for now as this is not helping. Proceed with colonoscopy with propofol with Dr. Abbey Chatters in the near future for further evaluation.  The risks, benefits, and alternatives have been discussed in detail with patient. They have stated understanding and desire to proceed.  ASA III See separate instructions for diabetes medication adjustments. Requested progress report in 2 weeks to see how dietary adjustments are helping with diarrhea.  If no significant improvement, could consider  trying cholestyramine while waiting on procedures. Plan to follow-up after procedures.

## 2019-09-18 ENCOUNTER — Telehealth: Payer: Self-pay | Admitting: *Deleted

## 2019-09-18 NOTE — Progress Notes (Signed)
CC'ED TO PCP 

## 2019-09-18 NOTE — Telephone Encounter (Signed)
LMOVM to schedule TCS/EGD with propofol with Dr. Abbey Chatters, ASA 3

## 2019-09-19 NOTE — Telephone Encounter (Signed)
Letter mailed

## 2019-09-24 NOTE — Telephone Encounter (Signed)
Called pt, TCS/EGD w/Prop w/Dr. Abbey Chatters scheduled for 11/06/19 at 12:45pm. Orders entered.

## 2019-09-24 NOTE — Telephone Encounter (Signed)
Returning a call to schedule procedure. [phone]

## 2019-09-25 NOTE — Telephone Encounter (Signed)
Pre-op/COVID test 11/02/19. Letter mailed with procedure instructions.

## 2019-09-26 ENCOUNTER — Ambulatory Visit: Payer: Medicare Other | Admitting: Gastroenterology

## 2019-10-01 ENCOUNTER — Telehealth: Payer: Self-pay | Admitting: Gastroenterology

## 2019-10-01 NOTE — Telephone Encounter (Signed)
Angie, can you call patient to see how her diarrhea is doing?   At her last OV on 09/12/19, I advised she follow a lactose free diet and low fat diet. She was to continue imodium as needed and stop bentyl as this was not helping her diarrhea.   Has she noted any improvement in diarrhea with dietary changes?  How many BMs per day?  Any BMs at night? How often is she taking imodium?

## 2019-10-02 NOTE — Telephone Encounter (Signed)
Lmom for pt to call us back. 

## 2019-10-03 ENCOUNTER — Telehealth: Payer: Self-pay | Admitting: Internal Medicine

## 2019-10-03 NOTE — Telephone Encounter (Signed)
Pt called back but message was routed to my box.  Would you like me call her back?

## 2019-10-03 NOTE — Telephone Encounter (Signed)
Called pt and she said that she still has spells of diarrhea.  She said that she has seen improvement with dietary changes.  She said that she had spaghetti and salad last night and had diarrhea all night and this morning because of it.  She took imodium last night and this morning.  She has stopped bentyl.  She is complaining of pain on both sides of belly.  She says something still doesn't feel right.  She says that every day is different as far as bm's.  Some days she doesn't have one and other days she may have 3 or 4 depending on what she has ate that day.  Pt says that she has to take imodium about 2 times a day.  Pt says most of the time she doesn't have a bm at night.

## 2019-10-03 NOTE — Telephone Encounter (Signed)
Tried to call patient to discuss further. No answer. Left a voicemail requesting return call.   Angie FYI

## 2019-10-03 NOTE — Telephone Encounter (Signed)
Aliene Altes, PA tried to call pt back.  See other note.

## 2019-10-03 NOTE — Telephone Encounter (Signed)
Patient called back  Did not know if you called her again

## 2019-10-03 NOTE — Telephone Encounter (Signed)
Tried calling patient. No answer. Left VM. Again requested return call.  Tammy Love. If patient calls back, I would like to talk with her.

## 2019-10-04 NOTE — Telephone Encounter (Signed)
Noted  

## 2019-10-17 ENCOUNTER — Telehealth: Payer: Self-pay | Admitting: Internal Medicine

## 2019-10-17 ENCOUNTER — Other Ambulatory Visit: Payer: Self-pay | Admitting: Gastroenterology

## 2019-10-17 DIAGNOSIS — R197 Diarrhea, unspecified: Secondary | ICD-10-CM

## 2019-10-17 MED ORDER — HYOSCYAMINE SULFATE 0.125 MG PO TABS: 0.1250 mg | ORAL_TABLET | Freq: Three times a day (TID) | ORAL | 0 refills | Status: DC | PRN

## 2019-10-17 NOTE — Telephone Encounter (Signed)
Tried calling patient again. No answer. Left message on machine requesting return call.   If patient returns call, I would like to speak with her if I am available. If I am not available, please get an update on how she is doing and ask for a good number and time for me to return her call.

## 2019-10-17 NOTE — Telephone Encounter (Signed)
Patient called and said someone from here called her, please call back if it was the nurse

## 2019-10-17 NOTE — Telephone Encounter (Signed)
Routing to [employer], [place]. Pt returned call.

## 2019-10-17 NOTE — Telephone Encounter (Signed)
Spoke with patient. She continues with diarrhea. Overall, she feels this is unchanged. She is following a lactose-free diet and a low-fat diet. She will have diarrhea with about 5 watery BMs per day; then she will go 5 days with no bowel movement at all and the cycle repeats. No return of rectal bleeding. No BM today. She is taking 2 Imodium daily whether she does or does not have diarrhea. Notes rectal burning when having diarrhea. Feels abdominal pain has worsened. Only occurs a couple days a week but has become more severe on those days. Has been present with diarrhea. Has also been present without diarrhea. She notes she did have some abdominal pain when she was on Bentyl but it was not as bad. Abdominal pain is not affected by meals. Reports abdominal pain has been present for quite some time and cannot give me a timeframe. No abdominal pain today. Notably, abdominal exam was benign at her office visit on 8/4.   We discussed the possibility of a CT scan to evaluate her abdominal pain, but patient prefers to hold off on this. She is interested in trying Levsin rather than resuming Bentyl. We discussed the fact that she may have overflow diarrhea secondary to persistent use of Imodium. She may also have IBS as she has had some worsening of abdominal pain since Bentyl was discontinued.   Recommendations: 1. I am sending in Levsin 0.125 mg. Prescription will be for 3 times daily. I have advised patient to start with once daily dosing to see if this improves her abdominal pain and increase as needed. We discussed side effects to monitor for including urinary retention, dry mouth, dizziness. I requested she call us with any adverse effects. 2. Advised that she only use Imodium on the days that she has diarrhea. 3. Recommended Preparation H twice daily as needed for rectal burning. Suspect this is likely secondary to hemorrhoids in the setting of frequent stools. 4. Requested progress report in 2 weeks.

## 2019-10-17 NOTE — Telephone Encounter (Signed)
Pt said she was returning a call. [phone]

## 2019-10-23 NOTE — Telephone Encounter (Signed)
Pt called back and informed me that the medicine (hyoscyamine) we prescribed has helped her a lot.  Pt says she has bm to seep out at night during her sleep.  She said that this has been happening for awhile.  She said that she has forgotten to mention this to [place] in the past.  Pt said that she is not having any abd pain today.  Pt says that each day is different.

## 2019-10-23 NOTE — Telephone Encounter (Signed)
I am glad she feels the hyoscyamine has helped with the abdominal pain. Has it also helped with the diarrhea? How often is the taking the medication?   We have discussed her intermittent stool incontinence in the past. Previously this was in the setting of the frequent diarrhea. I suspect this will continue to improve as diarrhea improves. Her upcoming colonoscopy will also help evaluate for possible causes of this. If it continues, and diarrhea is controlled and the colonoscopy is normal, we may need to consider further evaluation, but we will focus on controlling the diarrhea and having the colonoscopy completed first. If needed, she could wear briefs to bed to prevent any soiling while we are working on this.

## 2019-10-23 NOTE — Telephone Encounter (Signed)
Tried to call pt for an update since no response from her yet.  Had to [facility].

## 2019-10-24 ENCOUNTER — Telehealth: Payer: Self-pay | Admitting: Internal Medicine

## 2019-10-24 NOTE — Telephone Encounter (Signed)
PATIENT CALLED AND SAID SOMEONE FROM HERE CALLED HER   PLEASE CALL BACK

## 2019-10-24 NOTE — Telephone Encounter (Signed)
Called pt. I'm not sure who called pt. There is no documentation that someone called pt today.

## 2019-10-24 NOTE — Telephone Encounter (Signed)
Lmom for pt to call me back. 

## 2019-10-25 NOTE — Telephone Encounter (Signed)
Lmom for pt to call back. 

## 2019-10-25 NOTE — Telephone Encounter (Signed)
We can try Questran 4 g daily with breakfast to see if this will help with her diarrhea.  If she is agreeable, I will send in a prescription.  She will need to administer other oral medications ?1 hour before or 4 to 6 hours after Questran.  She can continue taking Levsin as needed for abdominal pain.

## 2019-10-25 NOTE — Telephone Encounter (Signed)
Called pt and she said that medication did not help with the diarrhea.  Pt said that diarrhea is just running out of her today.  She took hyoscyamine today.  She said that she is taking it as needed.  She said that she takes it about 3 times a week.  Pt was made aware of Kristen Harper's recommendations and plan of care.

## 2019-10-26 ENCOUNTER — Telehealth: Payer: Self-pay | Admitting: Internal Medicine

## 2019-10-26 ENCOUNTER — Other Ambulatory Visit: Payer: Self-pay | Admitting: Gastroenterology

## 2019-10-26 DIAGNOSIS — R197 Diarrhea, unspecified: Secondary | ICD-10-CM

## 2019-10-26 MED ORDER — CHOLESTYRAMINE 4 G PO PACK: 4.0000 g | PACK | Freq: Every day | ORAL | 5 refills | Status: AC

## 2019-10-26 NOTE — Telephone Encounter (Signed)
Returned pt's call. See other note.  

## 2019-10-26 NOTE — Telephone Encounter (Signed)
Rx sent 

## 2019-10-26 NOTE — Telephone Encounter (Signed)
Called pt back and informed her of Aliene Altes, PA's recommendations.  Pt would like RX for Questran sent to [facility].  She voiced understanding to administration of medication.  She was informed that she could continue taking Levsin as needed for abd pain.

## 2019-10-26 NOTE — Telephone Encounter (Signed)
Noted  

## 2019-10-26 NOTE — Telephone Encounter (Signed)
Patient returned call, please call back  

## 2019-10-26 NOTE — Telephone Encounter (Signed)
Lmom for pt to call back. 

## 2019-11-01 ENCOUNTER — Encounter (HOSPITAL_COMMUNITY): Payer: Self-pay

## 2019-11-01 ENCOUNTER — Other Ambulatory Visit: Payer: Self-pay

## 2019-11-02 ENCOUNTER — Encounter (HOSPITAL_COMMUNITY)
Admission: RE | Admit: 2019-11-02 | Discharge: 2019-11-02 | Disposition: A | Discharge status: Home / Self Care | Payer: Medicare Other | Source: Ambulatory Visit | Attending: Internal Medicine | Admitting: Internal Medicine

## 2019-11-02 ENCOUNTER — Other Ambulatory Visit (HOSPITAL_COMMUNITY)
Admission: RE | Admit: 2019-11-02 | Discharge: 2019-11-02 | Disposition: A | Discharge status: Home / Self Care | Payer: Medicare Other | Source: Ambulatory Visit | Attending: Internal Medicine | Admitting: Internal Medicine

## 2019-11-02 DIAGNOSIS — Z20822 Contact with and (suspected) exposure to covid-19: Secondary | ICD-10-CM | POA: Diagnosis not present

## 2019-11-02 DIAGNOSIS — Z01812 Encounter for preprocedural laboratory examination: Secondary | ICD-10-CM | POA: Diagnosis present

## 2019-11-02 LAB — SARS CORONAVIRUS 2 (TAT 6-24 HRS): SARS Coronavirus 2: NEGATIVE

## 2019-11-06 ENCOUNTER — Ambulatory Visit (HOSPITAL_COMMUNITY): Payer: Medicare Other | Admitting: Anesthesiology

## 2019-11-06 ENCOUNTER — Encounter (HOSPITAL_COMMUNITY)
Admission: RE | Disposition: A | Discharge status: Home / Self Care | Payer: Self-pay | Source: Home / Self Care | Attending: Internal Medicine

## 2019-11-06 ENCOUNTER — Ambulatory Visit (HOSPITAL_COMMUNITY)
Admission: RE | Admit: 2019-11-06 | Discharge: 2019-11-06 | Disposition: A | Discharge status: Home / Self Care | Payer: Medicare Other | Attending: Internal Medicine | Admitting: Internal Medicine

## 2019-11-06 DIAGNOSIS — K319 Disease of stomach and duodenum, unspecified: Secondary | ICD-10-CM | POA: Insufficient documentation

## 2019-11-06 DIAGNOSIS — E7800 Pure hypercholesterolemia, unspecified: Secondary | ICD-10-CM | POA: Diagnosis not present

## 2019-11-06 DIAGNOSIS — K648 Other hemorrhoids: Secondary | ICD-10-CM | POA: Diagnosis not present

## 2019-11-06 DIAGNOSIS — K625 Hemorrhage of anus and rectum: Secondary | ICD-10-CM

## 2019-11-06 DIAGNOSIS — Z85828 Personal history of other malignant neoplasm of skin: Secondary | ICD-10-CM | POA: Diagnosis not present

## 2019-11-06 DIAGNOSIS — K219 Gastro-esophageal reflux disease without esophagitis: Secondary | ICD-10-CM | POA: Diagnosis not present

## 2019-11-06 DIAGNOSIS — M1990 Unspecified osteoarthritis, unspecified site: Secondary | ICD-10-CM | POA: Diagnosis not present

## 2019-11-06 DIAGNOSIS — K635 Polyp of colon: Secondary | ICD-10-CM

## 2019-11-06 DIAGNOSIS — F319 Bipolar disorder, unspecified: Secondary | ICD-10-CM | POA: Insufficient documentation

## 2019-11-06 DIAGNOSIS — E119 Type 2 diabetes mellitus without complications: Secondary | ICD-10-CM | POA: Insufficient documentation

## 2019-11-06 DIAGNOSIS — J45909 Unspecified asthma, uncomplicated: Secondary | ICD-10-CM | POA: Diagnosis not present

## 2019-11-06 DIAGNOSIS — I10 Essential (primary) hypertension: Secondary | ICD-10-CM | POA: Insufficient documentation

## 2019-11-06 DIAGNOSIS — Z87891 Personal history of nicotine dependence: Secondary | ICD-10-CM | POA: Insufficient documentation

## 2019-11-06 DIAGNOSIS — K5730 Diverticulosis of large intestine without perforation or abscess without bleeding: Secondary | ICD-10-CM | POA: Insufficient documentation

## 2019-11-06 DIAGNOSIS — D123 Benign neoplasm of transverse colon: Secondary | ICD-10-CM | POA: Diagnosis not present

## 2019-11-06 DIAGNOSIS — Z8719 Personal history of other diseases of the digestive system: Secondary | ICD-10-CM | POA: Diagnosis not present

## 2019-11-06 DIAGNOSIS — R197 Diarrhea, unspecified: Secondary | ICD-10-CM | POA: Insufficient documentation

## 2019-11-06 DIAGNOSIS — Z9049 Acquired absence of other specified parts of digestive tract: Secondary | ICD-10-CM | POA: Diagnosis not present

## 2019-11-06 DIAGNOSIS — Z8249 Family history of ischemic heart disease and other diseases of the circulatory system: Secondary | ICD-10-CM | POA: Insufficient documentation

## 2019-11-06 DIAGNOSIS — K2970 Gastritis, unspecified, without bleeding: Secondary | ICD-10-CM

## 2019-11-06 HISTORY — PX: COLONOSCOPY WITH PROPOFOL: SHX5780

## 2019-11-06 HISTORY — PX: POLYPECTOMY: SHX5525

## 2019-11-06 HISTORY — PX: BIOPSY: SHX5522

## 2019-11-06 HISTORY — PX: ESOPHAGOGASTRODUODENOSCOPY (EGD) WITH PROPOFOL: SHX5813

## 2019-11-06 LAB — GLUCOSE, CAPILLARY
Glucose-Capillary: 125 mg/dL — ABNORMAL HIGH (ref 70–99)
Glucose-Capillary: 77 mg/dL (ref 70–99)

## 2019-11-06 SURGERY — COLONOSCOPY WITH PROPOFOL
Anesthesia: General

## 2019-11-06 MED ORDER — CHLORHEXIDINE GLUCONATE CLOTH 2 % EX PADS: 6.0000 | MEDICATED_PAD | Freq: Once | CUTANEOUS | Status: DC

## 2019-11-06 MED ORDER — DEXTROSE 50 % IV SOLN
25.0000 g | Freq: Once | INTRAVENOUS | Status: AC
  Administered 2019-11-06: 25 g via INTRAVENOUS

## 2019-11-06 MED ORDER — GLYCOPYRROLATE 0.2 MG/ML IJ SOLN
INTRAMUSCULAR | Status: AC
  Filled 2019-11-06: qty 1

## 2019-11-06 MED ORDER — LACTATED RINGERS IV SOLN: Freq: Once | INTRAVENOUS | Status: DC

## 2019-11-06 MED ORDER — DEXTROSE 50 % IV SOLN
INTRAVENOUS | Status: AC
  Filled 2019-11-06: qty 50

## 2019-11-06 MED ORDER — LACTATED RINGERS IV SOLN: Freq: Once | INTRAVENOUS | Status: AC

## 2019-11-06 MED ORDER — FENTANYL CITRATE (PF) 100 MCG/2ML IJ SOLN
INTRAMUSCULAR | Status: AC
  Filled 2019-11-06: qty 2

## 2019-11-06 MED ORDER — PROPOFOL 10 MG/ML IV BOLUS
INTRAVENOUS | Status: DC | PRN
  Administered 2019-11-06: 100 mg via INTRAVENOUS
  Administered 2019-11-06: 150 ug/kg/min via INTRAVENOUS

## 2019-11-06 MED ORDER — LIDOCAINE VISCOUS HCL 2 % MT SOLN
OROMUCOSAL | Status: AC
  Filled 2019-11-06: qty 15

## 2019-11-06 MED ORDER — FENTANYL CITRATE (PF) 100 MCG/2ML IJ SOLN
25.0000 ug | INTRAMUSCULAR | Status: AC | PRN
  Administered 2019-11-06 (×2): 25 ug via INTRAVENOUS

## 2019-11-06 MED ORDER — GLYCOPYRROLATE 0.2 MG/ML IJ SOLN
0.2000 mg | Freq: Once | INTRAMUSCULAR | Status: AC
  Administered 2019-11-06: 0.2 mg via INTRAVENOUS

## 2019-11-06 MED ORDER — LACTATED RINGERS IV SOLN: INTRAVENOUS | Status: DC | PRN

## 2019-11-06 MED ORDER — LIDOCAINE VISCOUS HCL 2 % MT SOLN
15.0000 mL | Freq: Once | OROMUCOSAL | Status: AC
  Administered 2019-11-06: 15 mL via OROMUCOSAL

## 2019-11-06 NOTE — H&P (Signed)
Primary Care Physician:  Keane Police, MD Primary Gastroenterologist:  Dr. Abbey Chatters  Pre-Procedure History & Physical: HPI:  Tammy Love is a 74 y.o. female is here for an EGD for nausea and abnormal CT and  colonoscopy to be performed for diarrhea and abdominal pain  Past Medical History:  Diagnosis Date  . Arthritis   . Asthma   . Bipolar disorder ([place])   . Cancer ([place])    skin cancer on hands  . Diabetes ([place])   . GERD (gastroesophageal reflux disease)   . Glaucoma   . Headache   . Heart murmur   . Hiatal hernia   . Hypercholesterolemia   . Hypertension   . Sinus disease     Past Surgical History:  Procedure Laterality Date  . ABDOMINAL HYSTERECTOMY    . BIOPSY  09/27/2017   Procedure: BIOPSY;  Surgeon: Danie Binder, MD;  Location: [facility];  Service: Endoscopy;;  random colon, duodenal, gastric  . CHOLECYSTECTOMY    . colonoscopy  2011   Dr. Earley Brooke: mucosa up to terminal ileum appeared normal, biopsies from TI and rectum. MICROSCOPIC COLITIS   . COLONOSCOPY WITH PROPOFOL N/A 09/27/2017   Dr. Oneida Alar: Terminal ileum normal.  Diverticulosis.  Internal hemorrhoids. Five 4-14mm polyps, two 5-42mm polyps removed, tubular adenomas. random colon bx negative. next TCS 3 years.   Marland Kitchen DILATION AND CURETTAGE OF UTERUS     x3  . ESOPHAGOGASTRODUODENOSCOPY (EGD) WITH PROPOFOL N/A 09/27/2017   Dr. Oneida Alar: LA grade B esophagitis, diffuse inflammation with hemorrhage, congestion, erosions in the stomach.  Diffuse severe inflammation with erosions, congestion and shallow ulcerations in the entire duodenum.  Severe duodenitis with erosions, reactive gastropathy.  No H. pylori.  Marland Kitchen NASAL SINUS SURGERY    . POLYPECTOMY  09/27/2017   Procedure: POLYPECTOMY;  Surgeon: Danie Binder, MD;  Location: [facility];  Service: Endoscopy;;  . REPLACEMENT TOTAL KNEE     left  . TONSILLECTOMY    . TUBAL LIGATION      Prior to Admission medications   Medication Sig Start Date End Date Taking?  Authorizing Provider  acetaminophen (TYLENOL) 500 MG tablet Take 500 mg by mouth as needed for mild pain or headache.    Yes [provider]  albuterol (PROVENTIL HFA;VENTOLIN HFA) 108 (90 Base) MCG/ACT inhaler Inhale 2 puffs into the lungs every 6 (six) hours as needed for wheezing or shortness of breath.   Yes [provider]  atorvastatin (LIPITOR) 80 MG tablet Take 80 mg by mouth at bedtime.    Yes [provider]  Calcium Carb-Cholecalciferol (CALCIUM + D3 PO) Take 1 tablet by mouth daily.   Yes [provider]  cholestyramine (QUESTRAN) 4 g packet Take 1 packet (4 g total) by mouth daily with breakfast. Administer other oral medications >1 hour before or 4 to 6 hours after cholestyramine. 10/26/19 10/25/20 Yes Erenest Rasher, PA-C  diclofenac Sodium (VOLTAREN) 1 % GEL Apply 1 application topically 4 (four) times daily as needed (pain.).   Yes [provider]  fluticasone (FLONASE) 50 MCG/ACT nasal spray Place 2 sprays into both nostrils daily as needed for allergies or rhinitis.    Yes [provider]  Glucosamine-Chondroitin (COSAMIN DS PO) Take 2 tablets by mouth daily.   Yes [provider]  hyoscyamine (LEVSIN) 0.125 MG tablet Take 1 tablet (0.125 mg total) by mouth every 8 (eight) hours as needed. Start with once daily and increase as needed. Patient taking differently:  Take 0.125 mg by mouth every 8 (eight) hours as needed (cramping/spasms.).  10/17/19  Yes Aliene Altes S, PA-C  ipratropium (ATROVENT) 0.06 % nasal spray Place 2 sprays into both nostrils 4 (four) times daily as needed (allergies.).  09/28/19  Yes [provider]  ketoconazole (NIZORAL) 2 % cream Apply 1 application topically daily as needed for irritation (foot fungus).   Yes [provider]  lisinopril (PRINIVIL,ZESTRIL) 10 MG tablet Take 10 mg by mouth daily.   Yes [provider]  loperamide (IMODIUM) 2 MG capsule Take 2 mg by mouth  2 (two) times daily as needed for diarrhea or loose stools.   Yes [provider]  Magnesium 400 MG TABS Take 400 mg by mouth daily.   Yes [provider]  Multiple Vitamin (MULTIVITAMIN WITH MINERALS) TABS tablet Take 1 tablet by mouth daily. Centrum Silver   Yes [provider]  omeprazole (PRILOSEC) 20 MG capsule Take 20 mg by mouth daily before breakfast.   Yes [provider]  ondansetron (ZOFRAN) 4 MG tablet Take 1 tablet (4 mg total) by mouth every 8 (eight) hours as needed for nausea or vomiting. 09/12/19  Yes Aliene Altes S, PA-C  potassium chloride (MICRO-K) 10 MEQ CR capsule Take 10 mEq by mouth daily.    Yes [provider]  QUEtiapine (SEROQUEL) 25 MG tablet Take 25 mg by mouth at bedtime.   Yes [provider]  sertraline (ZOLOFT) 50 MG tablet Take 50 mg by mouth at bedtime.    Yes [provider]  sitaGLIPtin (JANUVIA) 100 MG tablet Take 100 mg by mouth daily.   Yes [provider]  SYMBICORT 80-4.5 MCG/ACT inhaler Inhale 2 puffs into the lungs in the morning and at bedtime.  04/06/17  Yes [provider]  topiramate (TOPAMAX) 100 MG tablet Take 100-300 mg by mouth See admin instructions. Take 1 tablet (100 mg) by mouth in the morning, and 3 tablets (300 mg) by mouth at bedtime 04/07/17  Yes [provider]  sucralfate (CARAFATE) 1 GM/10ML suspension Take 1 g by mouth 4 (four) times daily as needed (for indigestion.).  Patient not taking: Reported on 09/12/2019    [provider]    Allergies as of 09/24/2019 - Review Complete 09/12/2019  Allergen Reaction Noted  . Ivp dye [iodinated diagnostic agents] Anaphylaxis, Itching, Swelling, and Rash 03/31/2016  . Amoxicillin-pot clavulanate Itching and Rash 12/15/2009  . Lincocin [lincomycin hcl] Itching and Rash 04/06/2016  . Nitrofurantoin Itching and Rash 12/15/2009  . Penicillins Itching and Rash 12/15/2009  . Sulfa antibiotics Itching  and Rash 12/15/2009    Family History  Problem Relation Age of Onset  . Colon cancer Sister [place]   . Hypertension Sister     Social History   Socioeconomic History  . Marital status: Widowed    Spouse name: Not on file  . Number of children: Not on file  . Years of education: Not on file  . Highest education level: Not on file  Occupational History  . Not on file  Tobacco Use  . Smoking status: Former Smoker    Packs/day: 2.00    Years: 20.00    Pack years: 40.00    Types: Cigarettes    Quit date: 04/08/2004    Years since quitting: 15.5  . Smokeless tobacco: Never Used  Vaping Use  . Vaping Use: Never used  Substance and Sexual Activity  . Alcohol use:  No  . Drug use: No  . Sexual activity: Not Currently    Birth control/protection: Surgical  Other Topics Concern  . Not on file  Social History Narrative  . Not on file   Social Determinants of Health   Financial Resource Strain:   . Difficulty of Paying Living Expenses: Not on file  Food Insecurity:   . Worried About Charity fundraiser in the Last Year: Not on file  . Ran Out of Food in the Last Year: Not on file  Transportation Needs:   . Lack of Transportation (Medical): Not on file  . Lack of Transportation (Non-Medical): Not on file  Physical Activity:   . Days of Exercise per Week: Not on file  . Minutes of Exercise per Session: Not on file  Stress:   . Feeling of Stress : Not on file  Social Connections:   . Frequency of Communication with Friends and Family: Not on file  . Frequency of Social Gatherings with Friends and Family: Not on file  . Attends Religious Services: Not on file  . Active Member of Clubs or Organizations: Not on file  . Attends Archivist Meetings: Not on file  . Marital Status: Not on file  Intimate Partner Violence:   . Fear of Current or Ex-Partner: Not on file  . Emotionally Abused: Not on file  . Physically Abused: Not on file  . Sexually  Abused: Not on file    Review of Systems: See HPI, otherwise negative ROS  Impression/Plan: Tammy Love is here for an EGD for nausea and abnormal CT and  colonoscopy to be performed for diarrhea and abdominal pain  Risks, benefits, limitations, imponderables and alternatives regarding colonoscopy have been reviewed with the patient. Questions have been answered. All parties agreeable.

## 2019-11-06 NOTE — Transfer of Care (Signed)
Immediate Anesthesia Transfer of Care Note  Patient: Tammy Love  Procedure(s) Performed: COLONOSCOPY WITH PROPOFOL (N/A ) ESOPHAGOGASTRODUODENOSCOPY (EGD) WITH PROPOFOL (N/A ) BIOPSY POLYPECTOMY  Patient Location: PACU  Anesthesia Type:General  Level of Consciousness: awake, alert , oriented and patient cooperative  Airway & Oxygen Therapy: Patient Spontanous Breathing  Post-op Assessment: Report given to RN, Post -op Vital signs reviewed and stable and Patient moving all extremities  Post vital signs: Reviewed and stable  Last Vitals:  Vitals Value Taken Time  BP    Temp    Pulse 76 11/06/19 1312  Resp 10 11/06/19 1312  SpO2 96 % 11/06/19 1312  Vitals shown include unvalidated device data.  Last Pain:  Vitals:   11/06/19 1243  TempSrc:   PainSc: 8       Patients Stated Pain Goal: 5 (48/18/59 0931)  Complications: No complications documented.

## 2019-11-06 NOTE — Op Note (Signed)
[facility] Patient Name: Tammy Love Procedure Date: 11/06/2019 12:54 PM MRN: [id number] Date of Birth: Aug 12, 1945 Attending MD: Elon Alas. Abbey Chatters DO CSN: [id number] Age: 74 Admit Type: Outpatient Procedure:                Colonoscopy Indications:              Clinically significant diarrhea of unexplained                            origin, Rectal bleeding Providers:                Elon Alas. Abbey Chatters, DO, Janeece Riggers, RN, Gwenlyn Fudge RN, RN, Wynonia Musty Tech, Technician Referring MD:              Medicines:                See the Anesthesia note for documentation of the                            administered medications Complications:            No immediate complications. Estimated Blood Loss:     Estimated blood loss was minimal. Procedure:                Pre-Anesthesia Assessment:                           - The anesthesia plan was to use monitored                            anesthesia care (MAC).                           After obtaining informed consent, the colonoscope                            was passed under direct vision. Throughout the                            procedure, the patient's blood pressure, pulse, and                            oxygen saturations were monitored continuously. The                            PCF-H190DL ([id number]) scope was introduced through                            the anus and advanced to the the cecum, identified                            by appendiceal orifice and ileocecal valve. The                            colonoscopy was performed without  difficulty. The                            patient tolerated the procedure well. The quality                            of the bowel preparation was evaluated using the                            BBPS [facility] Bowel Preparation Scale) with scores                            of: Right Colon = 2 (minor amount of residual                            staining, small  fragments of stool and/or opaque                            liquid, but mucosa seen well), Transverse Colon = 2                            (minor amount of residual staining, small fragments                            of stool and/or opaque liquid, but mucosa seen                            well) and Left Colon = 2 (minor amount of residual                            staining, small fragments of stool and/or opaque                            liquid, but mucosa seen well). The total BBPS score                            equals 6. The quality of the bowel preparation was                            fair. Scope In: 12:54:31 PM Scope Out: 1:08:01 PM Scope Withdrawal Time: 0 hours 8 minutes 15 seconds  Total Procedure Duration: 0 hours 13 minutes 30 seconds  Findings:      The perianal and digital rectal examinations were normal.      Non-bleeding internal hemorrhoids were found during endoscopy.      Multiple small-mouthed diverticula were found in the sigmoid colon and       descending colon.      A 2 mm polyp was found in the transverse colon. The polyp was removed       with a cold biopsy forceps. Resection and retrieval were complete.      Biopsies for histology were taken with a cold forceps from the ascending       colon, transverse colon and descending colon for evaluation of  microscopic colitis.      The colon (entire examined portion) was moderately redundant. Advancing       the scope required using manual pressure. Impression:               - Preparation of the colon was fair.                           - Non-bleeding internal hemorrhoids.                           - Diverticulosis in the sigmoid colon and in the                            descending colon.                           - One 2 mm polyp in the transverse colon, removed                            with a cold biopsy forceps. Resected and retrieved.                           - Redundant colon.                            - Biopsies were taken with a cold forceps from the                            ascending colon, transverse colon and descending                            colon for evaluation of microscopic colitis. Moderate Sedation:      Per Anesthesia Care Recommendation:           - Patient has a contact number available for                            emergencies. The signs and symptoms of potential                            delayed complications were discussed with the                            patient. Return to normal activities tomorrow.                            Written discharge instructions were provided to the                            patient.                           - Resume previous diet.                           - Continue present medications.                           -  Await pathology results.                           - Repeat colonoscopy in 5 years for surveillance.                           - Return to GI clinic as previously scheduled. Procedure Code(s):        --- Professional ---                           [id number], Colonoscopy, flexible; with biopsy, single                            or multiple Diagnosis Code(s):        --- Professional ---                           K64.8, Other hemorrhoids                           K63.5, Polyp of colon                           R19.7, Diarrhea, unspecified                           K62.5, Hemorrhage of anus and rectum                           K57.30, Diverticulosis of large intestine without                            perforation or abscess without bleeding CPT copyright 2019 American Medical Association. All rights reserved. The codes documented in this report are preliminary and upon coder review may  be revised to meet current compliance requirements. Elon Alas. Abbey Chatters, DO [place] Abbey Chatters, DO 11/06/2019 1:11:58 PM This report has been signed electronically. Number of Addenda: 0

## 2019-11-06 NOTE — Discharge Instructions (Addendum)
EGD Discharge instructions Please read the instructions outlined below and refer to this sheet in the next few weeks. These discharge instructions provide you with general information on caring for yourself after you leave the hospital. Your doctor may also give you specific instructions. While your treatment has been planned according to the most current medical practices available, unavoidable complications occasionally occur. If you have any problems or questions after discharge, please call your doctor. ACTIVITY  You may resume your regular activity but move at a slower pace for the next 24 hours.   Take frequent rest periods for the next 24 hours.   Walking will help expel (get rid of) the air and reduce the bloated feeling in your abdomen.   No driving for 24 hours (because of the anesthesia (medicine) used during the test).   You may shower.   Do not sign any important legal documents or operate any machinery for 24 hours (because of the anesthesia used during the test).  NUTRITION  Drink plenty of fluids.   You may resume your normal diet.   Begin with a light meal and progress to your normal diet.   Avoid alcoholic beverages for 24 hours or as instructed by your caregiver.  MEDICATIONS  You may resume your normal medications unless your caregiver tells you otherwise.  WHAT YOU CAN EXPECT TODAY  You may experience abdominal discomfort such as a feeling of fullness or "gas" pains.  FOLLOW-UP  Your doctor will discuss the results of your test with you.  SEEK IMMEDIATE MEDICAL ATTENTION IF ANY OF THE FOLLOWING OCCUR:  Excessive nausea (feeling sick to your stomach) and/or vomiting.   Severe abdominal pain and distention (swelling).   Trouble swallowing.   Temperature over 101 F (37.8 C).   Rectal bleeding or vomiting of blood.    Colon Polyps  Polyps are tissue growths inside the body. Polyps can grow in many places, including the large intestine (colon). A  polyp may be a round bump or a mushroom-shaped growth. You could have one polyp or several. Most colon polyps are noncancerous (benign). However, some colon polyps can become cancerous over time. Finding and removing the polyps early can help prevent this. What are the causes? The exact cause of colon polyps is not known. What increases the risk? You are more likely to develop this condition if you:  Have a family history of colon cancer or colon polyps.  Are older than 45 or older than 45 if you are African American.  Have inflammatory bowel disease, such as ulcerative colitis or Crohn's disease.  Have certain hereditary conditions, such as: ? Familial adenomatous polyposis. ? Lynch syndrome. ? Turcot syndrome. ? Peutz-Jeghers syndrome.  Are overweight.  Smoke cigarettes.  Do not get enough exercise.  Drink too much alcohol.  Eat a diet that is high in fat and red meat and low in fiber.  Had childhood cancer that was treated with abdominal radiation. What are the signs or symptoms? Most polyps do not cause symptoms. If you have symptoms, they may include:  Blood coming from your rectum when having a bowel movement.  Blood in your stool. The stool may look dark red or black.  Abdominal pain.  A change in bowel habits, such as constipation or diarrhea. How is this diagnosed? This condition is diagnosed with a colonoscopy. This is a procedure in which a lighted, flexible scope is inserted into the anus and then passed into the colon to examine the area. Polyps are  sometimes found when a colonoscopy is done as part of routine cancer screening tests. How is this treated? Treatment for this condition involves removing any polyps that are found. Most polyps can be removed during a colonoscopy. Those polyps will then be tested for cancer. Additional treatment may be needed depending on the results of testing. Follow these instructions at home: Lifestyle  Maintain a healthy  weight, or lose weight if recommended by your health care provider.  Exercise every day or as told by your health care provider.  Do not use any products that contain nicotine or tobacco, such as cigarettes and e-cigarettes. If you need help quitting, ask your health care provider.  If you drink alcohol, limit how much you have: ? 0-1 drink a day for women. ? 0-2 drinks a day for men.  Be aware of how much alcohol is in your drink. In the [place] one drink equals one 12 oz bottle of beer (355 mL), one 5 oz glass of wine (148 mL), or one 1 oz shot of hard liquor (44 mL). Eating and drinking   Eat foods that are high in fiber, such as fruits, vegetables, and whole grains.  Eat foods that are high in calcium and vitamin D, such as milk, cheese, yogurt, eggs, liver, fish, and broccoli.  Limit foods that are high in fat, such as fried foods and desserts.  Limit the amount of red meat and processed meat you eat, such as hot dogs, sausage, bacon, and lunch meats. General instructions  Keep all follow-up visits as told by your health care provider. This is important. ? This includes having regularly scheduled colonoscopies. ? Talk to your health care provider about when you need a colonoscopy. Contact a health care provider if:  You have new or worsening bleeding during a bowel movement.  You have new or increased blood in your stool.  You have a change in bowel habits.  You lose weight for no known reason. Summary  Polyps are tissue growths inside the body. Polyps can grow in many places, including the colon.  Most colon polyps are noncancerous (benign), but some can become cancerous over time.  This condition is diagnosed with a colonoscopy.  Treatment for this condition involves removing any polyps that are found. Most polyps can be removed during a colonoscopy. This information is not intended to replace advice given to you by your health care provider. Make sure you discuss  any questions you have with your health care provider. Document Revised: 05/12/2017 Document Reviewed: 05/12/2017 Elsevier Patient Education  [place].       Colonoscopy Discharge Instructions  Read the instructions outlined below and refer to this sheet in the next few weeks. These discharge instructions provide you with general information on caring for yourself after you leave the hospital. Your doctor may also give you specific instructions. While your treatment has been planned according to the most current medical practices available, unavoidable complications occasionally occur.   ACTIVITY  You may resume your regular activity, but move at a slower pace for the next 24 hours.   Take frequent rest periods for the next 24 hours.   Walking will help get rid of the air and reduce the bloated feeling in your belly (abdomen).   No driving for 24 hours (because of the medicine (anesthesia) used during the test).    Do not sign any important legal documents or operate any machinery for 24 hours (because of the anesthesia used during the  test).  NUTRITION  Drink plenty of fluids.   You may resume your normal diet as instructed by your doctor.   Begin with a light meal and progress to your normal diet. Heavy or fried foods are harder to digest and may make you feel sick to your stomach (nauseated).   Avoid alcoholic beverages for 24 hours or as instructed.  MEDICATIONS  You may resume your normal medications unless your doctor tells you otherwise.  WHAT YOU CAN EXPECT TODAY  Some feelings of bloating in the abdomen.   Passage of more gas than usual.   Spotting of blood in your stool or on the toilet paper.  IF YOU HAD POLYPS REMOVED DURING THE COLONOSCOPY:  No aspirin products for 7 days or as instructed.   No alcohol for 7 days or as instructed.   Eat a soft diet for the next 24 hours.  FINDING OUT THE RESULTS OF YOUR TEST Not all test results are available  during your visit. If your test results are not back during the visit, make an appointment with your caregiver to find out the results. Do not assume everything is normal if you have not heard from your caregiver or the medical facility. It is important for you to follow up on all of your test results.  SEEK IMMEDIATE MEDICAL ATTENTION IF:  You have more than a spotting of blood in your stool.   Your belly is swollen (abdominal distention).   You are nauseated or vomiting.   You have a temperature over 101.   You have abdominal pain or discomfort that is severe or gets worse throughout the day.   Your EGD showed a large amount of inflammation.  I biopsied this to rule out an infection called H. pylori.  I should have these results back within a week or so.  I want to increase your omeprazole to 40 mg twice daily for the next 8 weeks.  Avoid NSAIDs.  Your colonoscopy revealed one small polyp which I removed successfully.  I also took biopsies throughout your entire colon to rule out a condition called microscopic colitis that can cause chronic diarrhea.  We should have these results back within a week.  I recommend repeating colonoscopy for surveillance purposes in 5 years.  Follow-up with GI as previously scheduled.  I hope you have a great rest of your week!  Elon Alas. Abbey Chatters, D.O. Gastroenterology and Hepatology [facility]

## 2019-11-06 NOTE — Op Note (Signed)
[facility] Patient Name: Tammy Love Procedure Date: 11/06/2019 12:37 PM MRN: [id number] Date of Birth: [birth year] Attending MD: Elon Alas. Abbey Chatters DO CSN: [id number] Age: 74 Admit Type: Outpatient Procedure:                Upper GI endoscopy Indications:              Epigastric abdominal pain, Abnormal CT of the GI                            tract, Nausea Providers:                Elon Alas. Abbey Chatters, DO, Janeece Riggers, RN, Otis Peak B.                            Sharon Seller, RN, Wynonia Musty Tech, Technician Referring MD:              Medicines:                See the Anesthesia note for documentation of the                            administered medications Complications:            No immediate complications. Estimated Blood Loss:     Estimated blood loss was minimal. Procedure:                Pre-Anesthesia Assessment:                           - The anesthesia plan was to use monitored                            anesthesia care (MAC).                           After obtaining informed consent, the endoscope was                            passed under direct vision. Throughout the                            procedure, the patient's blood pressure, pulse, and                            oxygen saturations were monitored continuously. The                            GIF-H190 ([id number]) scope was introduced through the                            mouth, and advanced to the second part of duodenum.                            The upper GI endoscopy was accomplished without  difficulty. The patient tolerated the procedure                            well. Scope In: 12:45:08 PM Scope Out: 12:49:27 PM Total Procedure Duration: 0 hours 4 minutes 19 seconds  Findings:      The Z-line was regular and was found 40 cm from the incisors.      Diffuse moderate inflammation characterized by erosions and erythema was       found in the entire examined stomach. Biopsies  were taken with a cold       forceps for Helicobacter pylori testing.      The duodenal bulb, first portion of the duodenum and second portion of       the duodenum were normal. Biopsies for histology were taken with a cold       forceps for evaluation of celiac disease. Impression:               - Z-line regular, 40 cm from the incisors.                           - Gastritis. Biopsied.                           - Normal duodenal bulb, first portion of the                            duodenum and second portion of the duodenum.                            Biopsied. Moderate Sedation:      Per Anesthesia Care Recommendation:           - Patient has a contact number available for                            emergencies. The signs and symptoms of potential                            delayed complications were discussed with the                            patient. Return to normal activities tomorrow.                            Written discharge instructions were provided to the                            patient.                           - Resume previous diet.                           - Continue present medications.                           - Await pathology results.                           -  Use Prilosec (omeprazole) 40 mg PO BID for 8                            weeks.                           - Return to GI clinic as previously scheduled. Procedure Code(s):        --- Professional ---                           [phone], Esophagogastroduodenoscopy, flexible,                            transoral; with biopsy, single or multiple Diagnosis Code(s):        --- Professional ---                           K29.70, Gastritis, unspecified, without bleeding                           R10.13, Epigastric pain                           R11.0, Nausea                           R93.3, Abnormal findings on diagnostic imaging of                            other parts of digestive tract CPT copyright 2019  American Medical Association. All rights reserved. The codes documented in this report are preliminary and upon coder review may  be revised to meet current compliance requirements. Elon Alas. Abbey Chatters, DO [place] Abbey Chatters, DO 11/06/2019 12:51:59 PM This report has been signed electronically. Number of Addenda: 0

## 2019-11-06 NOTE — Anesthesia Postprocedure Evaluation (Signed)
Anesthesia Post Note  Patient: Tammy Love  Procedure(s) Performed: COLONOSCOPY WITH PROPOFOL (N/A ) ESOPHAGOGASTRODUODENOSCOPY (EGD) WITH PROPOFOL (N/A ) BIOPSY POLYPECTOMY  Patient location during evaluation: PACU Anesthesia Type: General Level of consciousness: awake, oriented, awake and alert and patient cooperative Pain management: pain level controlled Vital Signs Assessment: post-procedure vital signs reviewed and stable Respiratory status: spontaneous breathing, respiratory function stable and nonlabored ventilation Cardiovascular status: blood pressure returned to baseline and stable Postop Assessment: no headache and no backache Anesthetic complications: no   No complications documented.   Last Vitals:  Vitals:   11/06/19 1050  BP: 131/66  Pulse: 74  Resp: 18  Temp: 36.7 C  SpO2: 96%    Last Pain:  Vitals:   11/06/19 1243  TempSrc:   PainSc: [place]

## 2019-11-06 NOTE — Anesthesia Preprocedure Evaluation (Signed)
Anesthesia Evaluation  Patient identified by MRN, date of birth, ID band Patient awake    Reviewed: Allergy & Precautions, NPO status , Patient's Chart, lab work & pertinent test results  History of Anesthesia Complications Negative for: history of anesthetic complications  Airway Mallampati: II  TM Distance: >3 FB Neck ROM: Full    Dental  (+) Dental Advisory Given, Missing   Pulmonary asthma , former smoker,    Pulmonary exam normal breath sounds clear to auscultation       Cardiovascular Exercise Tolerance: Poor hypertension, Pt. on medications + Valvular Problems/Murmurs  Rhythm:Regular Rate:Normal + Systolic murmurs Left ventricle: The cavity size was normal. Wall thickness was  increased in a pattern of mild LVH. Systolic function was normal.  The estimated ejection fraction was in the range of 55% to 60%.  Wall motion was normal; there were no regional wall motion  abnormalities. Left ventricular diastolic function parameters  were normal for the patient&'s age.  - Aortic valve: Mildly calcified annulus. Trileaflet. There was  mild regurgitation.  - Mitral valve: Mildly to moderately calcified annulus.  - Right atrium: Central venous pressure (est): 3 mm Hg.  - Atrial septum: No defect or patent foramen ovale was identified.  - Tricuspid valve: There was trivial regurgitation.  - Pulmonary arteries: Systolic pressure could not be accurately  estimated.  - Pericardium, extracardiac: There was no pericardial effusion.    Neuro/Psych  Headaches, PSYCHIATRIC DISORDERS Bipolar Disorder    GI/Hepatic Neg liver ROS, hiatal hernia, GERD  Medicated,  Endo/Other  diabetes, Well Controlled, Type 2, Oral Hypoglycemic Agents  Renal/GU negative Renal ROS  negative genitourinary   Musculoskeletal  (+) Arthritis , Osteoarthritis,    Abdominal   Peds  Hematology negative hematology ROS (+)   Anesthesia  Other Findings   Reproductive/Obstetrics                           Anesthesia Physical Anesthesia Plan  ASA: III  Anesthesia Plan: General   Post-op Pain Management:    Induction: Intravenous  PONV Risk Score and Plan: TIVA  Airway Management Planned: Nasal Cannula and Natural Airway  Additional Equipment:   Intra-op Plan:   Post-operative Plan:   Informed Consent: I have reviewed the patients History and Physical, chart, labs and discussed the procedure including the risks, benefits and alternatives for the proposed anesthesia with the patient or authorized representative who has indicated his/her understanding and acceptance.     Dental advisory given  Plan Discussed with: CRNA and Surgeon  Anesthesia Plan Comments:         Anesthesia Quick Evaluation

## 2019-11-08 ENCOUNTER — Other Ambulatory Visit: Payer: Self-pay

## 2019-11-08 LAB — SURGICAL PATHOLOGY

## 2019-11-09 ENCOUNTER — Encounter (HOSPITAL_COMMUNITY): Payer: Self-pay | Admitting: Internal Medicine

## 2019-11-20 ENCOUNTER — Telehealth: Payer: Self-pay | Admitting: Gastroenterology

## 2019-11-20 ENCOUNTER — Other Ambulatory Visit: Payer: Self-pay | Admitting: Gastroenterology

## 2019-11-20 DIAGNOSIS — R197 Diarrhea, unspecified: Secondary | ICD-10-CM

## 2019-11-20 NOTE — Telephone Encounter (Signed)
I do not see that patient has a follow-up scheduled since her colonoscopy. Please arrange follow-up in November.

## 2019-11-21 ENCOUNTER — Encounter: Payer: Self-pay | Admitting: Internal Medicine

## 2019-11-21 NOTE — Telephone Encounter (Signed)
PATIENT SCHEDULED AND LETTER SENT  °

## 2019-12-29 NOTE — Progress Notes (Deleted)
Referring Provider: Keane Police, MD Primary Care Physician:  Keane Police, MD Primary GI Physician: Dr. Abbey Chatters  No chief complaint on file.   HPI:   Tammy Love is a 74 y.o. female with history of multiple adenomatous colon polyps, constipation, esopahgitis, erosive gastritis, severe erosive duodenitis likely due to aspirin and Celebrex use in August 2019 at the time of EGD.  She is presenting today for follow-up of diarrhea, rectal bleeding, and nausea with vomiting. Prior evaluation of diarrhea in June 2021 with celiac serologies negative, GI pathogen panel negative. CT A/P without contrast 06/13/2019 with mild thickening at the duodenal bulb.  No bowel wall thickening.  At her last visit in August 2021, she reported 6 BMs a day, nocturnal BMs.  Consistency varied from pudding like to watery.  Stated all she had to do a stand up, and stools would run out.  Some incontinence due to urgency.  Reported she has had diarrhea for years predating cholecystectomy.  Had blood in the stool 3 to 4 days prior to office visit which was new.  Also with mucus in her stools.  Occasional abdominal pain.  She was taking dicyclomine before meals and 1 Imodium in the morning and 1 in the evening which was not helpful.  Reported intermittent nausea that was random without identified trigger with 1 episode of vomiting 3-4 days prior.  GERD well controlled on omeprazole 20 mg daily.  No NSAIDs. Plan to proceed with colonoscopy to evaluate rectal bleeding and persistent diarrhea, EGD for duodenal bulb thickening, update CBC, BMP, TSH, continue imodium, stop Bentyl, low fat diet, continue omeprazole.   Labs 09/12/19 with CBC wnl, CMP with mild bump in creatinine to 1.2, TSH wnl.   Telephone call 10/01/19>>: She continued with intermittent spell of diarrhea as well as abdominal pain. Reported abdominal pain was not as bad when on Bentyl previously. Plan to trial Levsin. She felt this helped her abdominal pain but  ultimately continued with diarrhea. Plan to trial Questran 4g daily.   Procedures 11/06/2019: Colonoscopy: Nonbleeding internal hemorrhoids, diverticulosis in the sigmoid and descending colon, 2 mm polyp in the transverse colon (tubular adenoma), random colon biopsies benign. Recommend repeat colonoscopy in 5 years. EGD: Gastritis s/p biopsy (nonspecific reactive gastropathy), normal examined duodenum s/p biopsied (benign). Increase Prilosec to 40 mg BID x 8 weeks.   Today:    Past Medical History:  Diagnosis Date  . Arthritis   . Asthma   . Bipolar disorder ([place])   . Cancer ([place])    skin cancer on hands  . Diabetes ([place])   . GERD (gastroesophageal reflux disease)   . Glaucoma   . Headache   . Heart murmur   . Hiatal hernia   . Hypercholesterolemia   . Hypertension   . Sinus disease     Past Surgical History:  Procedure Laterality Date  . ABDOMINAL HYSTERECTOMY    . BIOPSY  09/27/2017   Procedure: BIOPSY;  Surgeon: Danie Binder, MD;  [facility]: [facility];  Service: Endoscopy;;  random colon, duodenal, gastric  . BIOPSY  11/06/2019   Procedure: BIOPSY;  Surgeon: Eloise Harman, DO;  [facility]: [facility];  Service: Endoscopy;;  duodenum gastric random colon  . CHOLECYSTECTOMY    . colonoscopy  2011   Dr. Earley Brooke: mucosa up to terminal ileum appeared normal, biopsies from TI and rectum. MICROSCOPIC COLITIS   . COLONOSCOPY WITH PROPOFOL N/A 09/27/2017   Dr. Oneida Alar: Terminal ileum normal.  Diverticulosis.  Internal hemorrhoids. Five 4-46mm polyps, two 5-70mm polyps removed, tubular adenomas. random colon bx negative. next TCS 3 years.   . COLONOSCOPY WITH PROPOFOL N/A 11/06/2019   Procedure: COLONOSCOPY WITH PROPOFOL;  Surgeon: Eloise Harman, DO;  Location: [facility];  Service: Endoscopy;  Laterality: N/A;  12:45pm  . DILATION AND CURETTAGE OF UTERUS     x3  . ESOPHAGOGASTRODUODENOSCOPY (EGD) WITH PROPOFOL N/A 09/27/2017   Dr. Oneida Alar: LA grade B esophagitis,  diffuse inflammation with hemorrhage, congestion, erosions in the stomach.  Diffuse severe inflammation with erosions, congestion and shallow ulcerations in the entire duodenum.  Severe duodenitis with erosions, reactive gastropathy.  No H. pylori.  . ESOPHAGOGASTRODUODENOSCOPY (EGD) WITH PROPOFOL N/A 11/06/2019   Procedure: ESOPHAGOGASTRODUODENOSCOPY (EGD) WITH PROPOFOL;  Surgeon: Eloise Harman, DO;  [facility];  Service: Endoscopy;  Laterality: N/A;  . NASAL SINUS SURGERY    . POLYPECTOMY  09/27/2017   Procedure: POLYPECTOMY;  Surgeon: Danie Binder, MD;  [facility];  Service: Endoscopy;;  . POLYPECTOMY  11/06/2019   Procedure: POLYPECTOMY;  Surgeon: Eloise Harman, DO;  [facility]: [facility];  Service: Endoscopy;;  colon  . REPLACEMENT TOTAL KNEE     left  . TONSILLECTOMY    . TUBAL LIGATION      Current Outpatient Medications  Medication Sig Dispense Refill  . acetaminophen (TYLENOL) 500 MG tablet Take 500 mg by mouth as needed for mild pain or headache.     . albuterol (PROVENTIL HFA;VENTOLIN HFA) 108 (90 Base) MCG/ACT inhaler Inhale 2 puffs into the lungs every 6 (six) hours as needed for wheezing or shortness of breath.    Marland Kitchen atorvastatin (LIPITOR) 80 MG tablet Take 80 mg by mouth at bedtime.     . Calcium Carb-Cholecalciferol (CALCIUM + D3 PO) Take 1 tablet by mouth daily.    . cholestyramine (QUESTRAN) 4 g packet Take 1 packet (4 g total) by mouth daily with breakfast. Administer other oral medications >1 hour before or 4 to 6 hours after cholestyramine. 30 each 5  . diclofenac Sodium (VOLTAREN) 1 % GEL Apply 1 application topically 4 (four) times daily as needed (pain.).    Marland Kitchen fluticasone (FLONASE) 50 MCG/ACT nasal spray Place 2 sprays into both nostrils daily as needed for allergies or rhinitis.     . Glucosamine-Chondroitin (COSAMIN DS PO) Take 2 tablets by mouth daily.    . hyoscyamine (LEVSIN) 0.125 MG tablet TAKE 1 TABLET BY MOUTH EVERY 8 HOURS  AS NEEDED. START WITH ONCE DAILY AND INCREASE AS NEEDED 90 tablet 0  . ipratropium (ATROVENT) 0.06 % nasal spray Place 2 sprays into both nostrils 4 (four) times daily as needed (allergies.).     Marland Kitchen ketoconazole (NIZORAL) 2 % cream Apply 1 application topically daily as needed for irritation (foot fungus).    Marland Kitchen lisinopril (PRINIVIL,ZESTRIL) 10 MG tablet Take 10 mg by mouth daily.    Marland Kitchen loperamide (IMODIUM) 2 MG capsule Take 2 mg by mouth 2 (two) times daily as needed for diarrhea or loose stools.    . Magnesium 400 MG TABS Take 400 mg by mouth daily.    . Multiple Vitamin (MULTIVITAMIN WITH MINERALS) TABS tablet Take 1 tablet by mouth daily. Centrum Silver    . omeprazole (PRILOSEC) 20 MG capsule Take 20 mg by mouth daily before breakfast.    . ondansetron (ZOFRAN) 4 MG tablet Take 1 tablet (4 mg total) by mouth every 8 (eight) hours as needed for nausea  or vomiting. 30 tablet 0  . potassium chloride (MICRO-K) 10 MEQ CR capsule Take 10 mEq by mouth daily.     . QUEtiapine (SEROQUEL) 25 MG tablet Take 25 mg by mouth at bedtime.    . sertraline (ZOLOFT) 50 MG tablet Take 50 mg by mouth at bedtime.     . sitaGLIPtin (JANUVIA) 100 MG tablet Take 100 mg by mouth daily.    . sucralfate (CARAFATE) 1 GM/10ML suspension Take 1 g by mouth 4 (four) times daily as needed (for indigestion.).  (Patient not taking: Reported on 09/12/2019)    . SYMBICORT 80-4.5 MCG/ACT inhaler Inhale 2 puffs into the lungs in the morning and at bedtime.     . topiramate (TOPAMAX) 100 MG tablet Take 100-300 mg by mouth See admin instructions. Take 1 tablet (100 mg) by mouth in the morning, and 3 tablets (300 mg) by mouth at bedtime     No current facility-administered medications for this visit.    Allergies as of 12/31/2019 - Review Complete 11/06/2019  Allergen Reaction Noted  . Ivp dye [iodinated diagnostic agents] Anaphylaxis, Itching, Swelling, and Rash 03/31/2016  . Amoxicillin-pot clavulanate Itching and Rash 12/15/2009  .  Lincocin [lincomycin hcl] Itching and Rash 04/06/2016  . Nitrofurantoin Itching and Rash 12/15/2009  . Penicillins Itching and Rash 12/15/2009  . Sulfa antibiotics Itching and Rash 12/15/2009    Family History  Problem Relation Age of Onset  . Colon cancer Sister [place]   . Hypertension Sister     Social History   Socioeconomic History  . Marital status: Widowed    Spouse name: Not on file  . Number of children: Not on file  . Years of education: Not on file  . Highest education level: Not on file  Occupational History  . Not on file  Tobacco Use  . Smoking status: Former Smoker    Packs/day: 2.00    Years: 20.00    Pack years: 40.00    Types: Cigarettes    Quit date: 04/08/2004    Years since quitting: 15.7  . Smokeless tobacco: Never Used  Vaping Use  . Vaping Use: Never used  Substance and Sexual Activity  . Alcohol use: No  . Drug use: No  . Sexual activity: Not Currently    Birth control/protection: Surgical  Other Topics Concern  . Not on file  Social History Narrative  . Not on file   Social Determinants of Health   Financial Resource Strain:   . Difficulty of Paying Living Expenses: Not on file  Food Insecurity:   . Worried About Charity fundraiser in the Last Year: Not on file  . Ran Out of Food in the Last Year: Not on file  Transportation Needs:   . Lack of Transportation (Medical): Not on file  . Lack of Transportation (Non-Medical): Not on file  Physical Activity:   . Days of Exercise per Week: Not on file  . Minutes of Exercise per Session: Not on file  Stress:   . Feeling of Stress : Not on file  Social Connections:   . Frequency of Communication with Friends and Family: Not on file  . Frequency of Social Gatherings with Friends and Family: Not on file  . Attends Religious Services: Not on file  . Active Member of Clubs or Organizations: Not on file  . Attends Archivist Meetings: Not on file  . Marital Status:  Not on file  Review of Systems: Gen: Denies fever, chills, anorexia. Denies fatigue, weakness, weight loss.  CV: Denies chest pain, palpitations, syncope, peripheral edema, and claudication. Resp: Denies dyspnea at rest, cough, wheezing, coughing up blood, and pleurisy. GI: Denies vomiting blood, jaundice, and fecal incontinence.   Denies dysphagia or odynophagia. Derm: Denies rash, itching, dry skin Psych: Denies depression, anxiety, memory loss, confusion. No homicidal or suicidal ideation.  Heme: Denies bruising, bleeding, and enlarged lymph nodes.  Physical Exam: There were no vitals taken for this visit. General:   Alert and oriented. No distress noted. Pleasant and cooperative.  Head:  Normocephalic and atraumatic. Eyes:  Conjuctiva clear without scleral icterus. Mouth:  Oral mucosa pink and moist. Good dentition. No lesions. Heart:  S1, S2 present without murmurs appreciated. Lungs:  Clear to auscultation bilaterally. No wheezes, rales, or rhonchi. No distress.  Abdomen:  +BS, soft, non-tender and non-distended. No rebound or guarding. No HSM or masses noted. Msk:  Symmetrical without gross deformities. Normal posture. Extremities:  Without edema. Neurologic:  Alert and  oriented x4 Psych:  Alert and cooperative. Normal mood and affect.

## 2019-12-31 ENCOUNTER — Ambulatory Visit: Payer: Medicare Other | Admitting: Gastroenterology

## 2020-01-01 ENCOUNTER — Encounter: Payer: Self-pay | Admitting: Internal Medicine

## 2021-06-01 IMAGING — CT CT ABD-PELV W/O CM
2 of 4 series · 17 of 46 positions shown, 19 images · non-contrast
Comparison: CT 04/08/2016

CLINICAL DATA: Mid abdominal pain with nausea vomiting and diarrhea

EXAM:
CT ABDOMEN AND PELVIS WITHOUT CONTRAST
TECHNIQUE: Multidetector CT imaging of the abdomen and pelvis was performed
following the standard protocol without IV contrast.

[Series 2: axial st · axial · 0.98mm/px · z∈[+969,+1299]mm · 14 of 78 slices shown, 16 images]
[im 6/78  soft-tissue]
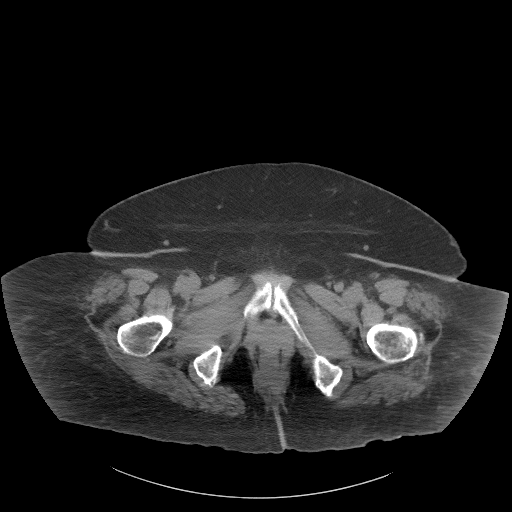
[im 6/78  bone]
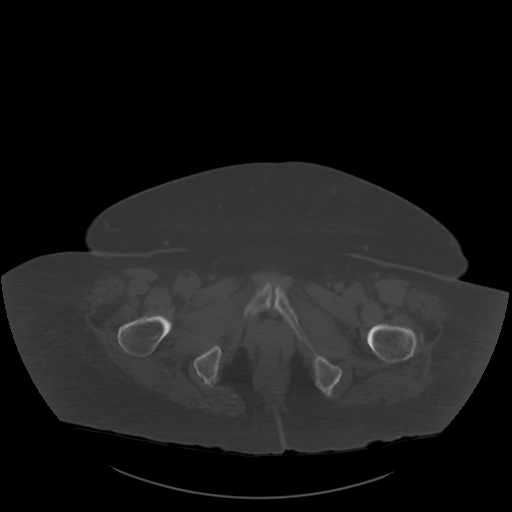
[im 11/78  soft-tissue]
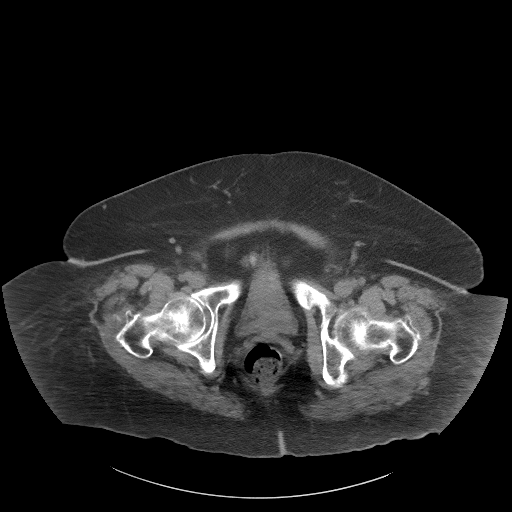
[im 16/78  soft-tissue]
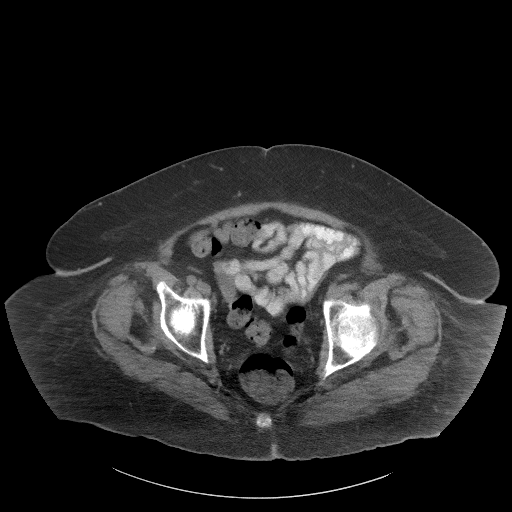
[im 21/78  soft-tissue]
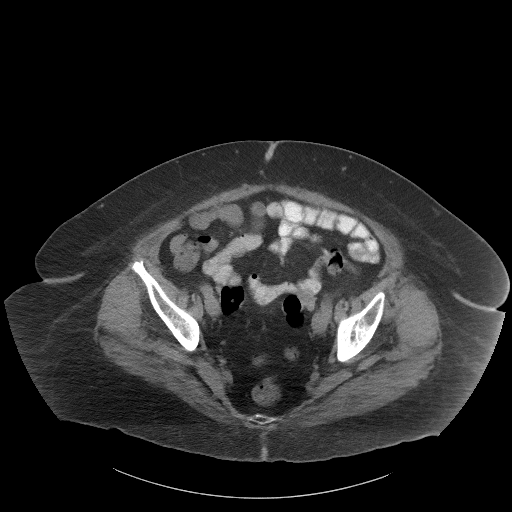
[im 26/78  soft-tissue]
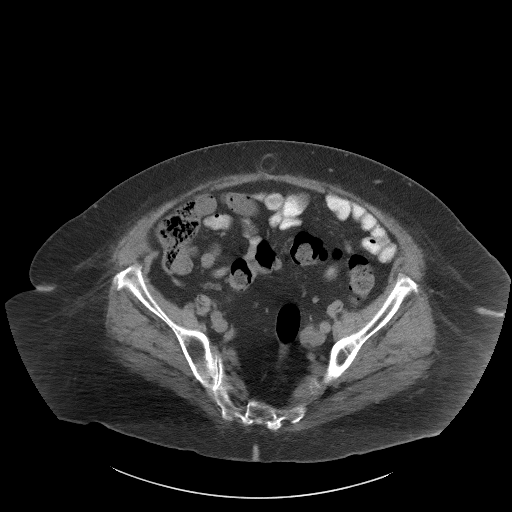
[im 31/78  soft-tissue]
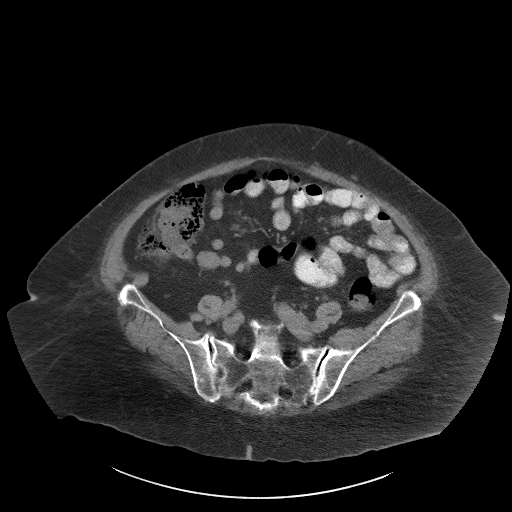
[im 36/78  soft-tissue]
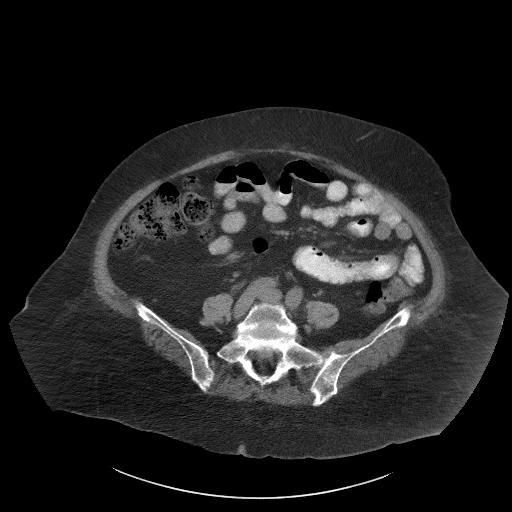
[im 42/78  soft-tissue]
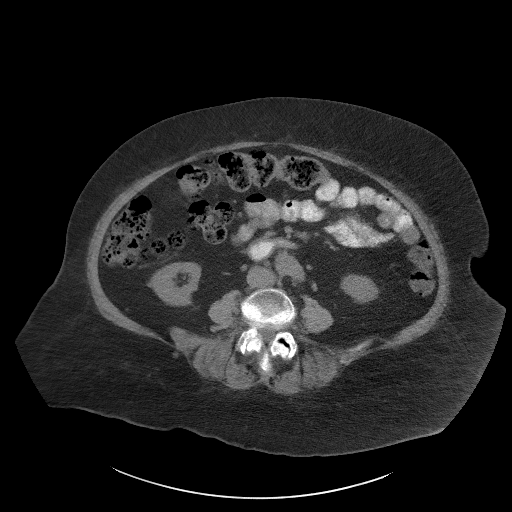
[im 47/78  soft-tissue]
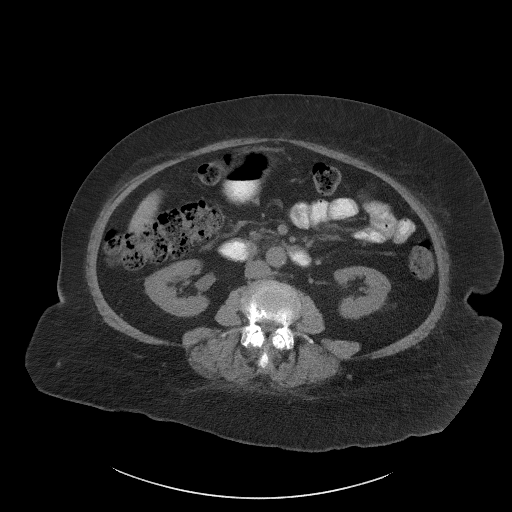
[im 47/78  bone]
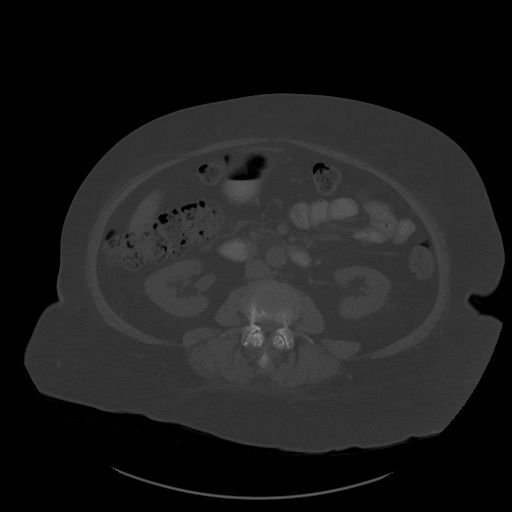
[im 52/78  soft-tissue]
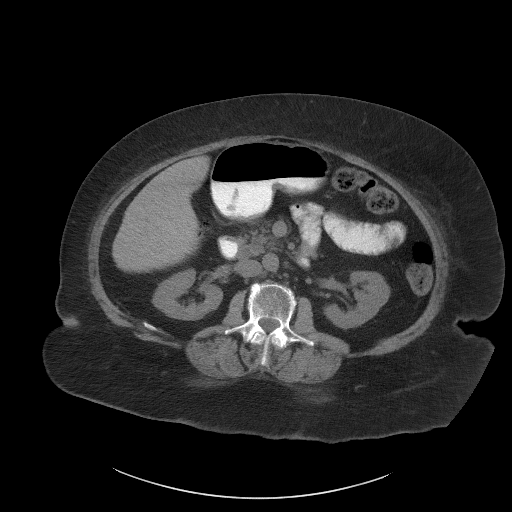
[im 57/78  soft-tissue]
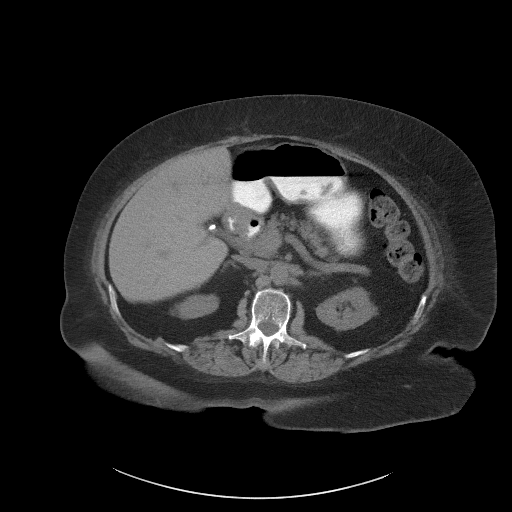
[im 62/78  soft-tissue]
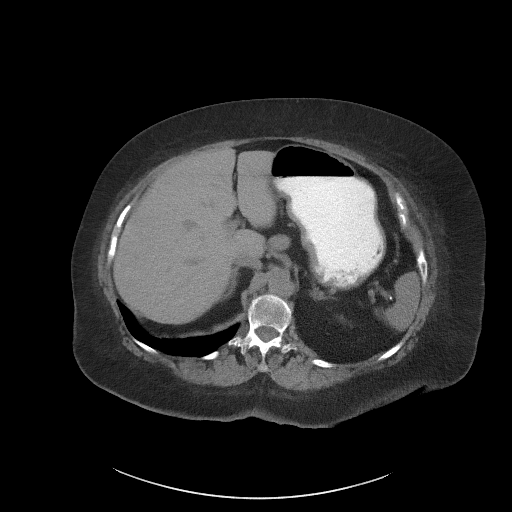
[im 67/78  soft-tissue]
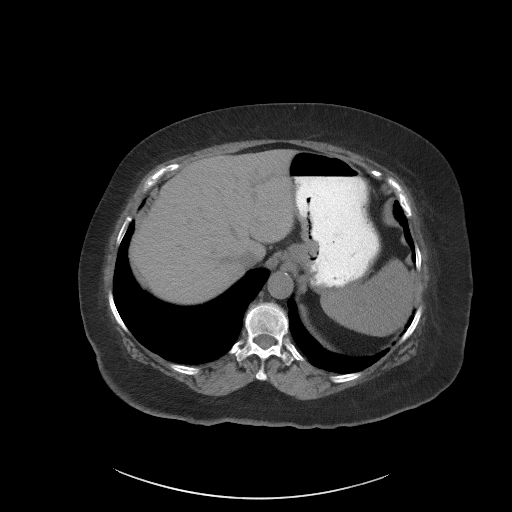
[im 72/78  soft-tissue]
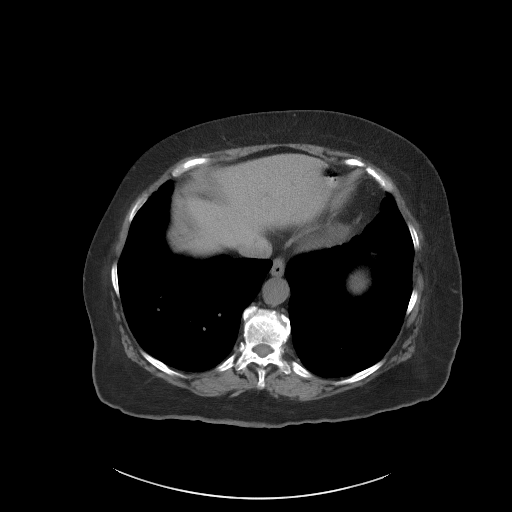

[Series 5: coronal st · coronal · 0.77mm/px · 3 of 110 slices shown]
[im 37/110  soft-tissue]
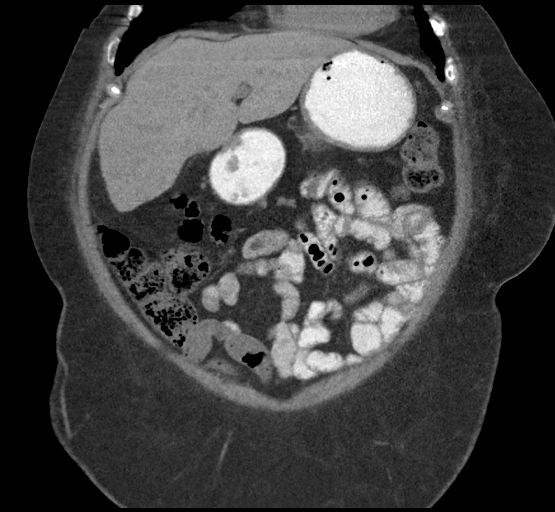
[im 49/110  soft-tissue]
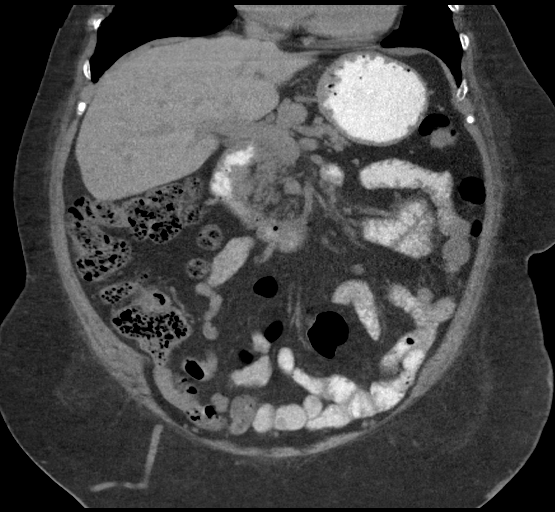
[im 61/110  soft-tissue]
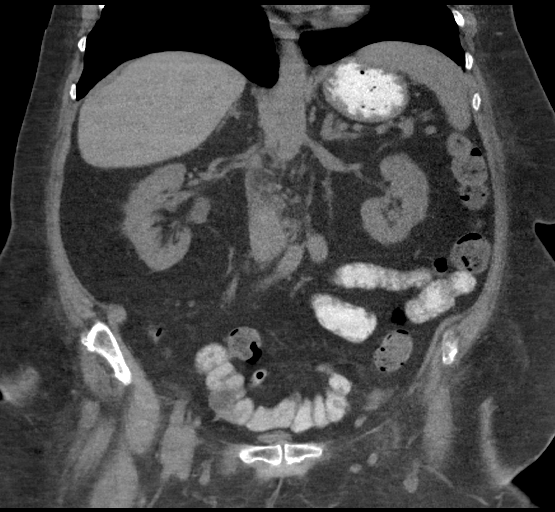

[17 of 46 positions shown; findings below may reference images not displayed]

FINDINGS: Lower chest: Lung bases demonstrate no acute consolidation or
pleural effusion.

Hepatobiliary: No focal liver abnormality is seen. Status post
cholecystectomy. No biliary dilatation.

Pancreas: Unremarkable. No pancreatic ductal dilatation or
surrounding inflammatory changes.

Spleen: Normal in size without focal abnormality.

Adrenals/Urinary Tract: Adrenal glands are normal. Kidneys show no
hydronephrosis. Probable intrarenal vascular calcification on the
left. The bladder is nearly empty

Stomach/Bowel: Stomach is nonenlarged. Mild thickening at the
duodenal bulb. No dilated small bowel. No bowel wall thickening.
Negative appendix.

Vascular/Lymphatic: Mild aortic atherosclerosis. No aneurysm. Stable
14 mm gastrohepatic lymph node.

Reproductive: Status post hysterectomy. No adnexal masses.

Other: Negative for free air or free fluid. Small fat containing
umbilical hernia

Musculoskeletal: No acute or significant osseous findings.
IMPRESSION: 1. Questionable mild duodenal bulb thickening though without
significant inflammatory process, question peptic ulcer disease.
2. Otherwise no CT evidence for acute intra-abdominal or pelvic
abnormality
3. Stable prominent gastrohepatic lymph node presumed benign given
lack of change since [DATE]
